# Patient Record
Sex: Male | Born: 1965 | Race: White | Hispanic: No | Marital: Single | Smoking: Former smoker
Health system: Southern US, Community
[De-identification: ages and names within clinical notes are randomized; demographics above are authoritative.]

## PROBLEM LIST (undated history)

## (undated) DIAGNOSIS — S069XAA Unspecified intracranial injury with loss of consciousness status unknown, initial encounter: Secondary | ICD-10-CM

## (undated) DIAGNOSIS — S060X9A Concussion with loss of consciousness of unspecified duration, initial encounter: Secondary | ICD-10-CM

## (undated) DIAGNOSIS — R519 Headache, unspecified: Secondary | ICD-10-CM

## (undated) DIAGNOSIS — K219 Gastro-esophageal reflux disease without esophagitis: Secondary | ICD-10-CM

## (undated) DIAGNOSIS — S2691XA Contusion of heart, unspecified with or without hemopericardium, initial encounter: Secondary | ICD-10-CM

## (undated) DIAGNOSIS — M1990 Unspecified osteoarthritis, unspecified site: Secondary | ICD-10-CM

## (undated) DIAGNOSIS — S060XAA Concussion with loss of consciousness status unknown, initial encounter: Secondary | ICD-10-CM

## (undated) DIAGNOSIS — Z8719 Personal history of other diseases of the digestive system: Secondary | ICD-10-CM

## (undated) DIAGNOSIS — R112 Nausea with vomiting, unspecified: Secondary | ICD-10-CM

## (undated) DIAGNOSIS — F419 Anxiety disorder, unspecified: Secondary | ICD-10-CM

## (undated) DIAGNOSIS — S069X9A Unspecified intracranial injury with loss of consciousness of unspecified duration, initial encounter: Secondary | ICD-10-CM

## (undated) DIAGNOSIS — S199XXA Unspecified injury of neck, initial encounter: Secondary | ICD-10-CM

## (undated) DIAGNOSIS — R51 Headache: Secondary | ICD-10-CM

## (undated) DIAGNOSIS — G8929 Other chronic pain: Secondary | ICD-10-CM

## (undated) DIAGNOSIS — F449 Dissociative and conversion disorder, unspecified: Secondary | ICD-10-CM

## (undated) DIAGNOSIS — S2220XA Unspecified fracture of sternum, initial encounter for closed fracture: Secondary | ICD-10-CM

## (undated) DIAGNOSIS — F32A Depression, unspecified: Secondary | ICD-10-CM

## (undated) DIAGNOSIS — F4310 Post-traumatic stress disorder, unspecified: Secondary | ICD-10-CM

## (undated) DIAGNOSIS — M546 Pain in thoracic spine: Secondary | ICD-10-CM

## (undated) DIAGNOSIS — M549 Dorsalgia, unspecified: Secondary | ICD-10-CM

## (undated) DIAGNOSIS — T7840XA Allergy, unspecified, initial encounter: Secondary | ICD-10-CM

## (undated) DIAGNOSIS — Z9889 Other specified postprocedural states: Secondary | ICD-10-CM

## (undated) DIAGNOSIS — G43909 Migraine, unspecified, not intractable, without status migrainosus: Secondary | ICD-10-CM

## (undated) DIAGNOSIS — Z8489 Family history of other specified conditions: Secondary | ICD-10-CM

## (undated) DIAGNOSIS — I499 Cardiac arrhythmia, unspecified: Secondary | ICD-10-CM

## (undated) HISTORY — DX: Allergy, unspecified, initial encounter: T78.40XA

## (undated) HISTORY — PX: SHOULDER ARTHROSCOPY W/ ROTATOR CUFF REPAIR: SHX2400

## (undated) HISTORY — PX: TONSILLECTOMY: SUR1361

## (undated) HISTORY — DX: Anxiety disorder, unspecified: F41.9

## (undated) HISTORY — DX: Unspecified osteoarthritis, unspecified site: M19.90

## (undated) HISTORY — PX: CYST EXCISION: SHX5701

## (undated) HISTORY — PX: KNEE ARTHROSCOPY: SHX127

---

## 1999-11-18 ENCOUNTER — Ambulatory Visit (HOSPITAL_COMMUNITY): Admission: RE | Admit: 1999-11-18 | Discharge: 1999-11-18 | Payer: Self-pay | Admitting: Specialist

## 1999-11-18 ENCOUNTER — Encounter: Payer: Self-pay | Admitting: Specialist

## 1999-12-09 ENCOUNTER — Encounter: Payer: Self-pay | Admitting: Specialist

## 1999-12-09 ENCOUNTER — Ambulatory Visit (HOSPITAL_COMMUNITY): Admission: RE | Admit: 1999-12-09 | Discharge: 1999-12-09 | Payer: Self-pay | Admitting: Specialist

## 2002-12-01 ENCOUNTER — Emergency Department (HOSPITAL_COMMUNITY): Admission: EM | Admit: 2002-12-01 | Discharge: 2002-12-01 | Payer: Self-pay | Admitting: Emergency Medicine

## 2002-12-01 ENCOUNTER — Encounter: Payer: Self-pay | Admitting: Emergency Medicine

## 2003-01-07 ENCOUNTER — Encounter: Payer: Self-pay | Admitting: Family Medicine

## 2003-01-07 ENCOUNTER — Encounter: Admission: RE | Admit: 2003-01-07 | Discharge: 2003-01-07 | Payer: Self-pay | Admitting: Family Medicine

## 2003-05-07 ENCOUNTER — Encounter: Payer: Self-pay | Admitting: Emergency Medicine

## 2003-05-07 ENCOUNTER — Emergency Department (HOSPITAL_COMMUNITY): Admission: EM | Admit: 2003-05-07 | Discharge: 2003-05-07 | Payer: Self-pay | Admitting: Emergency Medicine

## 2003-06-11 ENCOUNTER — Encounter: Admission: RE | Admit: 2003-06-11 | Discharge: 2003-06-11 | Payer: Self-pay | Admitting: Family Medicine

## 2003-06-11 ENCOUNTER — Encounter: Payer: Self-pay | Admitting: Family Medicine

## 2003-11-30 ENCOUNTER — Encounter: Admission: RE | Admit: 2003-11-30 | Discharge: 2003-11-30 | Payer: Self-pay | Admitting: Family Medicine

## 2004-12-26 ENCOUNTER — Encounter: Admission: RE | Admit: 2004-12-26 | Discharge: 2004-12-26 | Payer: Self-pay | Admitting: Family Medicine

## 2005-09-14 ENCOUNTER — Encounter: Admission: RE | Admit: 2005-09-14 | Discharge: 2005-09-14 | Payer: Self-pay | Admitting: Family Medicine

## 2006-05-21 ENCOUNTER — Other Ambulatory Visit: Payer: Self-pay

## 2006-05-21 ENCOUNTER — Emergency Department: Payer: Self-pay | Admitting: Unknown Physician Specialty

## 2007-03-11 ENCOUNTER — Other Ambulatory Visit: Payer: Self-pay

## 2007-03-11 ENCOUNTER — Emergency Department: Payer: Self-pay | Admitting: Emergency Medicine

## 2007-07-26 ENCOUNTER — Ambulatory Visit: Payer: Self-pay | Admitting: Orthopedic Surgery

## 2007-08-14 ENCOUNTER — Ambulatory Visit: Payer: Self-pay | Admitting: Orthopedic Surgery

## 2008-07-18 ENCOUNTER — Emergency Department: Payer: Self-pay | Admitting: Emergency Medicine

## 2008-12-18 HISTORY — PX: LIPOMA EXCISION: SHX5283

## 2010-07-18 ENCOUNTER — Emergency Department: Payer: Self-pay | Admitting: Emergency Medicine

## 2010-07-25 ENCOUNTER — Ambulatory Visit: Payer: Self-pay | Admitting: Surgery

## 2010-07-27 ENCOUNTER — Ambulatory Visit: Payer: Self-pay | Admitting: Surgery

## 2010-07-28 LAB — PATHOLOGY REPORT

## 2011-03-29 ENCOUNTER — Emergency Department: Payer: Self-pay | Admitting: Unknown Physician Specialty

## 2012-10-10 ENCOUNTER — Emergency Department: Payer: Self-pay | Admitting: Internal Medicine

## 2012-10-10 LAB — COMPREHENSIVE METABOLIC PANEL
Albumin: 4.2 g/dL (ref 3.4–5.0)
Anion Gap: 7 (ref 7–16)
BUN: 8 mg/dL (ref 7–18)
Bilirubin,Total: 0.9 mg/dL (ref 0.2–1.0)
Chloride: 102 mmol/L (ref 98–107)
Creatinine: 1.07 mg/dL (ref 0.60–1.30)
EGFR (Non-African Amer.): >60
Glucose: 88 mg/dL (ref 65–99)
Potassium: 4.2 mmol/L (ref 3.5–5.1)
SGOT(AST): 31 U/L (ref 15–37)
SGPT (ALT): 20 U/L (ref 12–78)
Sodium: 139 mmol/L (ref 136–145)
Total Protein: 7.6 g/dL (ref 6.4–8.2)

## 2012-10-10 LAB — CBC
HCT: 50.5 % (ref 40.0–52.0)
MCH: 31.2 pg (ref 26.0–34.0)
MCHC: 34.4 g/dL (ref 32.0–36.0)
MCV: 91 fL (ref 80–100)
Platelet: 256 10*3/uL (ref 150–440)
RBC: 5.57 10*6/uL (ref 4.40–5.90)

## 2012-10-10 LAB — PROTIME-INR: INR: 1

## 2012-10-23 ENCOUNTER — Other Ambulatory Visit: Payer: Self-pay | Admitting: Internal Medicine

## 2012-10-23 NOTE — Telephone Encounter (Signed)
Chart pulled to PA pool at nurses station DOS 02/25/09

## 2012-10-24 ENCOUNTER — Other Ambulatory Visit: Payer: Self-pay | Admitting: Internal Medicine

## 2012-10-24 NOTE — Telephone Encounter (Signed)
Chart pulled to PA pool at nurses station DOS 02/25/09 °

## 2013-05-22 ENCOUNTER — Ambulatory Visit: Payer: BC Managed Care – PPO

## 2013-05-22 ENCOUNTER — Ambulatory Visit (INDEPENDENT_AMBULATORY_CARE_PROVIDER_SITE_OTHER): Payer: BC Managed Care – PPO | Admitting: Family Medicine

## 2013-05-22 VITALS — BP 142/82 | HR 75 | Temp 97.7°F | Resp 18 | Ht 68.0 in | Wt 152.6 lb

## 2013-05-22 DIAGNOSIS — M25511 Pain in right shoulder: Secondary | ICD-10-CM

## 2013-05-22 DIAGNOSIS — M25519 Pain in unspecified shoulder: Secondary | ICD-10-CM

## 2013-05-22 MED ORDER — HYDROCODONE-ACETAMINOPHEN 5-325 MG PO TABS: 1.0000 | ORAL_TABLET | Freq: Four times a day (QID) | ORAL | Status: DC | PRN
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
## 2013-05-22 NOTE — Patient Instructions (Signed)
We will refer you to orthopaedic doctor.  Use the sling for comfort, hydrocodone if needed for pain. Sling as needed - avoid use at work until seen by ortho.  Return to the clinic or go to the nearest emergency room if any of your symptoms worsen or new symptoms occur.

## 2013-05-23 ENCOUNTER — Telehealth: Payer: Self-pay

## 2013-05-23 DIAGNOSIS — M25511 Pain in right shoulder: Secondary | ICD-10-CM

## 2013-05-23 MED ORDER — OXYCODONE-ACETAMINOPHEN 5-325 MG PO TABS: 1.0000 | ORAL_TABLET | Freq: Three times a day (TID) | ORAL | Status: DC | PRN

## 2013-05-23 NOTE — Telephone Encounter (Signed)
xrays are at front desk for pt to pick up.

## 2013-05-23 NOTE — Telephone Encounter (Signed)
Pt is calling again regarding a different pain medication being called into the pharmacy, pt states that he uses [facility] in [place]. Best# [phone]

## 2013-05-23 NOTE — Telephone Encounter (Signed)
Patient says that the pain medication we prescribed was not working would like to know if we can prescribe something else

## 2013-05-23 NOTE — Telephone Encounter (Signed)
Patient notified and will discontinue the use of Vicodin. He also needs a copy of his x-ray to take to [facility], he has an appt scheduled on 06/02/2013. He was informed of the possible dizziness/sedation while taking Percocet.

## 2013-05-23 NOTE — Telephone Encounter (Signed)
I will prescribe percocet in place of the vicodin, but if this is not helping  - needs to go to the emergency room or return to clinic. Advise that percocet may make more sedated/dizzy - do not drive or operate heavy machinery while taking this medicine.

## 2013-05-29 ENCOUNTER — Telehealth: Payer: Self-pay

## 2013-05-29 DIAGNOSIS — M25511 Pain in right shoulder: Secondary | ICD-10-CM

## 2013-05-29 MED ORDER — OXYCODONE-ACETAMINOPHEN 5-325 MG PO TABS: 1.0000 | ORAL_TABLET | Freq: Three times a day (TID) | ORAL | Status: DC | PRN

## 2013-05-29 NOTE — Telephone Encounter (Signed)
Pt would like to have a refill on his pain medication, pt states that he is will be out after today. Best# [phone]

## 2013-05-29 NOTE — Telephone Encounter (Signed)
Noted - will prescribe enough until he can be seen by ortho - should have appt scheduled in 4 days.

## 2013-05-30 NOTE — Telephone Encounter (Signed)
Notified pt Rx ready for p/up. Pt stated he is in a lot of pain, but we were able to get an appt for Monday w/ortho.

## 2015-11-18 DIAGNOSIS — S2691XA Contusion of heart, unspecified with or without hemopericardium, initial encounter: Secondary | ICD-10-CM

## 2015-11-18 HISTORY — DX: Contusion of heart, unspecified with or without hemopericardium, initial encounter: S26.91XA

## 2015-12-15 ENCOUNTER — Emergency Department (HOSPITAL_COMMUNITY): Payer: 59

## 2015-12-15 ENCOUNTER — Encounter (HOSPITAL_COMMUNITY): Payer: Self-pay | Admitting: *Deleted

## 2015-12-15 ENCOUNTER — Observation Stay (HOSPITAL_COMMUNITY)
Admission: EM | Admit: 2015-12-15 | Discharge: 2015-12-18 | Disposition: A | Discharge status: Home / Self Care | Payer: 59 | Attending: General Surgery | Admitting: General Surgery

## 2015-12-15 DIAGNOSIS — S060X0A Concussion without loss of consciousness, initial encounter: Secondary | ICD-10-CM | POA: Insufficient documentation

## 2015-12-15 DIAGNOSIS — G8929 Other chronic pain: Secondary | ICD-10-CM | POA: Diagnosis present

## 2015-12-15 DIAGNOSIS — S060X9A Concussion with loss of consciousness of unspecified duration, initial encounter: Secondary | ICD-10-CM | POA: Diagnosis present

## 2015-12-15 DIAGNOSIS — S060XAA Concussion with loss of consciousness status unknown, initial encounter: Secondary | ICD-10-CM | POA: Diagnosis present

## 2015-12-15 DIAGNOSIS — M25561 Pain in right knee: Secondary | ICD-10-CM | POA: Insufficient documentation

## 2015-12-15 DIAGNOSIS — S2220XA Unspecified fracture of sternum, initial encounter for closed fracture: Secondary | ICD-10-CM

## 2015-12-15 DIAGNOSIS — Y92410 Unspecified street and highway as the place of occurrence of the external cause: Secondary | ICD-10-CM | POA: Insufficient documentation

## 2015-12-15 DIAGNOSIS — S2222XA Fracture of body of sternum, initial encounter for closed fracture: Principal | ICD-10-CM | POA: Insufficient documentation

## 2015-12-15 DIAGNOSIS — S2691XA Contusion of heart, unspecified with or without hemopericardium, initial encounter: Secondary | ICD-10-CM | POA: Diagnosis present

## 2015-12-15 DIAGNOSIS — M25562 Pain in left knee: Secondary | ICD-10-CM | POA: Insufficient documentation

## 2015-12-15 HISTORY — DX: Gastro-esophageal reflux disease without esophagitis: K21.9

## 2015-12-15 HISTORY — DX: Migraine, unspecified, not intractable, without status migrainosus: G43.909

## 2015-12-15 HISTORY — DX: Concussion with loss of consciousness status unknown, initial encounter: S06.0XAA

## 2015-12-15 HISTORY — DX: Concussion with loss of consciousness of unspecified duration, initial encounter: S06.0X9A

## 2015-12-15 HISTORY — DX: Headache: R51

## 2015-12-15 HISTORY — DX: Unspecified fracture of sternum, initial encounter for closed fracture: S22.20XA

## 2015-12-15 HISTORY — DX: Unspecified injury of neck, initial encounter: S19.9XXA

## 2015-12-15 HISTORY — DX: Cardiac arrhythmia, unspecified: I49.9

## 2015-12-15 HISTORY — DX: Pain in thoracic spine: M54.6

## 2015-12-15 HISTORY — DX: Other specified postprocedural states: Z98.890

## 2015-12-15 HISTORY — DX: Other chronic pain: G89.29

## 2015-12-15 HISTORY — DX: Other specified postprocedural states: R11.2

## 2015-12-15 HISTORY — DX: Dorsalgia, unspecified: M54.9

## 2015-12-15 HISTORY — DX: Family history of other specified conditions: Z84.89

## 2015-12-15 HISTORY — DX: Personal history of other diseases of the digestive system: Z87.19

## 2015-12-15 HISTORY — DX: Headache, unspecified: R51.9

## 2015-12-15 LAB — CBC WITH DIFFERENTIAL/PLATELET
BASOS ABS: 0 10*3/uL (ref 0.0–0.1)
Basophils Relative: 0 %
Eosinophils Absolute: 0.3 10*3/uL (ref 0.0–0.7)
Eosinophils Relative: 2 %
HEMATOCRIT: 42 % (ref 39.0–52.0)
Hemoglobin: 13.6 g/dL (ref 13.0–17.0)
LYMPHS ABS: 3.4 10*3/uL (ref 0.7–4.0)
LYMPHS PCT: 19 %
MCH: 28.8 pg (ref 26.0–34.0)
MCHC: 32.4 g/dL (ref 30.0–36.0)
MCV: 89 fL (ref 78.0–100.0)
MONO ABS: 1.5 10*3/uL — AB (ref 0.1–1.0)
MONOS PCT: 9 %
NEUTROS ABS: 12.1 10*3/uL — AB (ref 1.7–7.7)
Neutrophils Relative %: 70 %
Platelets: 330 10*3/uL (ref 150–400)
RBC: 4.72 MIL/uL (ref 4.22–5.81)
RDW: 12.7 % (ref 11.5–15.5)
WBC: 17.2 10*3/uL — ABNORMAL HIGH (ref 4.0–10.5)

## 2015-12-15 LAB — BASIC METABOLIC PANEL
ANION GAP: 12 (ref 5–15)
BUN: <5 mg/dL — ABNORMAL LOW (ref 6–20)
CALCIUM: 9.3 mg/dL (ref 8.9–10.3)
CO2: 25 mmol/L (ref 22–32)
Chloride: 102 mmol/L (ref 101–111)
Creatinine, Ser: 1.1 mg/dL (ref 0.61–1.24)
GLUCOSE: 125 mg/dL — AB (ref 65–99)
POTASSIUM: 3.2 mmol/L — AB (ref 3.5–5.1)
Sodium: 139 mmol/L (ref 135–145)

## 2015-12-15 MED ORDER — ONDANSETRON HCL 4 MG/2ML IJ SOLN
4.0000 mg | Freq: Once | INTRAMUSCULAR | Status: DC
  Filled 2015-12-15: qty 2

## 2015-12-15 MED ORDER — SODIUM CHLORIDE 0.9 % IV BOLUS (SEPSIS)
1000.0000 mL | Freq: Once | INTRAVENOUS | Status: AC
  Administered 2015-12-15: 1000 mL via INTRAVENOUS

## 2015-12-15 MED ORDER — IOHEXOL 350 MG/ML SOLN
100.0000 mL | Freq: Once | INTRAVENOUS | Status: AC | PRN
  Administered 2015-12-15: 100 mL via INTRAVENOUS

## 2015-12-15 MED ORDER — DOCUSATE SODIUM 100 MG PO CAPS
100.0000 mg | ORAL_CAPSULE | Freq: Two times a day (BID) | ORAL | Status: DC
  Administered 2015-12-15 – 2015-12-18 (×6): 100 mg via ORAL
  Filled 2015-12-15 (×6): qty 1

## 2015-12-15 MED ORDER — GABAPENTIN 300 MG PO CAPS
600.0000 mg | ORAL_CAPSULE | Freq: Every evening | ORAL | Status: DC | PRN
  Administered 2015-12-17: 600 mg via ORAL
  Filled 2015-12-15: qty 2

## 2015-12-15 MED ORDER — MORPHINE SULFATE (PF) 4 MG/ML IV SOLN
4.0000 mg | INTRAVENOUS | Status: DC | PRN
  Administered 2015-12-15: 2 mg via INTRAVENOUS
  Administered 2015-12-15: 4 mg via INTRAVENOUS
  Filled 2015-12-15 (×3): qty 1

## 2015-12-15 MED ORDER — ENOXAPARIN SODIUM 40 MG/0.4ML ~~LOC~~ SOLN
40.0000 mg | SUBCUTANEOUS | Status: DC
  Administered 2015-12-15 – 2015-12-17 (×3): 40 mg via SUBCUTANEOUS
  Filled 2015-12-15 (×3): qty 0.4

## 2015-12-15 MED ORDER — HYDROMORPHONE HCL 1 MG/ML IJ SOLN
1.0000 mg | Freq: Once | INTRAMUSCULAR | Status: AC
  Administered 2015-12-15: 1 mg via INTRAVENOUS
  Filled 2015-12-15: qty 1

## 2015-12-15 MED ORDER — KCL IN DEXTROSE-NACL 40-5-0.45 MEQ/L-%-% IV SOLN
INTRAVENOUS | Status: DC
  Administered 2015-12-15: 18:00:00 via INTRAVENOUS
  Filled 2015-12-15 (×4): qty 1000

## 2015-12-15 MED ORDER — ALPRAZOLAM 0.5 MG PO TABS
0.5000 mg | ORAL_TABLET | Freq: Every day | ORAL | Status: DC
  Administered 2015-12-15 – 2015-12-17 (×3): 0.5 mg via ORAL
  Filled 2015-12-15 (×3): qty 1

## 2015-12-15 MED ORDER — ONDANSETRON HCL 4 MG/2ML IJ SOLN: 4.0000 mg | Freq: Four times a day (QID) | INTRAMUSCULAR | Status: DC | PRN

## 2015-12-15 MED ORDER — CYCLOBENZAPRINE HCL 10 MG PO TABS
10.0000 mg | ORAL_TABLET | Freq: Three times a day (TID) | ORAL | Status: DC | PRN
  Administered 2015-12-16 – 2015-12-17 (×2): 10 mg via ORAL
  Filled 2015-12-15 (×2): qty 1

## 2015-12-15 MED ORDER — KETAMINE HCL 10 MG/ML IJ SOLN
1.0000 mg/kg | Freq: Once | INTRAMUSCULAR | Status: DC
  Filled 2015-12-15: qty 7.4

## 2015-12-15 MED ORDER — OXYCODONE HCL 5 MG PO TABS
10.0000 mg | ORAL_TABLET | ORAL | Status: DC | PRN
  Administered 2015-12-15 – 2015-12-18 (×13): 20 mg via ORAL
  Filled 2015-12-15 (×13): qty 4

## 2015-12-15 MED ORDER — ONDANSETRON HCL 4 MG PO TABS: 4.0000 mg | ORAL_TABLET | Freq: Four times a day (QID) | ORAL | Status: DC | PRN

## 2015-12-15 MED ORDER — MIDAZOLAM HCL 2 MG/2ML IJ SOLN
2.0000 mg | Freq: Once | INTRAMUSCULAR | Status: DC | PRN
Start: 2015-12-15 — End: 2015-12-15
  Filled 2015-12-15: qty 2

## 2015-12-15 MED ORDER — POLYETHYLENE GLYCOL 3350 17 G PO PACK
17.0000 g | PACK | Freq: Every day | ORAL | Status: DC
  Filled 2015-12-15 (×3): qty 1

## 2015-12-15 MED ORDER — DICLOFENAC SODIUM 1 % TD GEL
2.0000 g | Freq: Four times a day (QID) | TRANSDERMAL | Status: DC | PRN
  Filled 2015-12-15: qty 100

## 2015-12-15 MED ORDER — MORPHINE SULFATE ER 30 MG PO TBCR
30.0000 mg | EXTENDED_RELEASE_TABLET | Freq: Two times a day (BID) | ORAL | Status: DC
  Filled 2015-12-15: qty 1

## 2015-12-15 NOTE — ED Notes (Signed)
Radiology advised patient unable to lie back for pelvic xray, Dr. Clydene PughKnott made aware, advised pelvic xray can be canceled.
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
## 2015-12-15 NOTE — ED Notes (Signed)
Dr. Clydene PughKnott aware that patient is still in a lot of pain, advised patient can get 1mg  Dilaudid IV

## 2015-12-15 NOTE — ED Notes (Signed)
MD at bedside. 

## 2015-12-15 NOTE — ED Notes (Signed)
Trauma PA at bedside. 

## 2015-12-15 NOTE — ED Notes (Signed)
Patient provided sandwich per request 
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
## 2015-12-15 NOTE — ED Notes (Signed)
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

## 2015-12-15 NOTE — ED Notes (Signed)
Pt returned from CT °

## 2015-12-15 NOTE — ED Notes (Signed)
Pt stated he would like to try CT scan without being medicated. Pt able to lie flat on stretcher and this time and tolerate. Pt transported to CT by CT transporter.
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
## 2015-12-15 NOTE — ED Notes (Signed)
Radiology brought patient back to the room stating they could not get all of his pictures because he was reporting his pain was too bad. DR. Clydene PughKnott made aware of situation.

## 2015-12-16 ENCOUNTER — Observation Stay (HOSPITAL_COMMUNITY): Payer: 59

## 2015-12-16 DIAGNOSIS — S2691XA Contusion of heart, unspecified with or without hemopericardium, initial encounter: Secondary | ICD-10-CM | POA: Diagnosis present

## 2015-12-16 DIAGNOSIS — S060X9A Concussion with loss of consciousness of unspecified duration, initial encounter: Secondary | ICD-10-CM | POA: Diagnosis present

## 2015-12-16 DIAGNOSIS — S060XAA Concussion with loss of consciousness status unknown, initial encounter: Secondary | ICD-10-CM | POA: Diagnosis present

## 2015-12-16 DIAGNOSIS — G8929 Other chronic pain: Secondary | ICD-10-CM | POA: Diagnosis present

## 2015-12-16 LAB — CBC
HCT: 36.7 % — ABNORMAL LOW (ref 39.0–52.0)
HEMOGLOBIN: 12.3 g/dL — AB (ref 13.0–17.0)
MCH: 29.9 pg (ref 26.0–34.0)
MCHC: 33.5 g/dL (ref 30.0–36.0)
MCV: 89.1 fL (ref 78.0–100.0)
Platelets: 264 10*3/uL (ref 150–400)
RBC: 4.12 MIL/uL — ABNORMAL LOW (ref 4.22–5.81)
RDW: 12.9 % (ref 11.5–15.5)
WBC: 12.9 10*3/uL — ABNORMAL HIGH (ref 4.0–10.5)

## 2015-12-16 LAB — BASIC METABOLIC PANEL
Anion gap: 6 (ref 5–15)
CALCIUM: 8.4 mg/dL — AB (ref 8.9–10.3)
CHLORIDE: 103 mmol/L (ref 101–111)
CO2: 27 mmol/L (ref 22–32)
CREATININE: 0.99 mg/dL (ref 0.61–1.24)
GFR calc Af Amer: >60 mL/min (ref >60)
Glucose, Bld: 120 mg/dL — ABNORMAL HIGH (ref 65–99)
Potassium: 3.4 mmol/L — ABNORMAL LOW (ref 3.5–5.1)
SODIUM: 136 mmol/L (ref 135–145)

## 2015-12-16 MED ORDER — NAPROXEN 250 MG PO TABS
500.0000 mg | ORAL_TABLET | Freq: Two times a day (BID) | ORAL | Status: DC
  Filled 2015-12-16: qty 2

## 2015-12-16 MED ORDER — WHITE PETROLATUM GEL
Status: AC
  Administered 2015-12-16: 16:00:00
  Filled 2015-12-16: qty 1

## 2015-12-16 MED ORDER — MORPHINE SULFATE ER 30 MG PO TBCR
45.0000 mg | EXTENDED_RELEASE_TABLET | Freq: Two times a day (BID) | ORAL | Status: DC
  Administered 2015-12-16: 45 mg via ORAL
  Filled 2015-12-16: qty 1

## 2015-12-16 MED ORDER — TRAMADOL HCL 50 MG PO TABS
100.0000 mg | ORAL_TABLET | Freq: Four times a day (QID) | ORAL | Status: DC
  Administered 2015-12-16 – 2015-12-18 (×10): 100 mg via ORAL
  Filled 2015-12-16 (×10): qty 2

## 2015-12-16 MED ORDER — POTASSIUM CHLORIDE CRYS ER 20 MEQ PO TBCR
20.0000 meq | EXTENDED_RELEASE_TABLET | Freq: Three times a day (TID) | ORAL | Status: DC
  Administered 2015-12-16 – 2015-12-18 (×7): 20 meq via ORAL
  Filled 2015-12-16 (×7): qty 1

## 2015-12-16 MED ORDER — KETOROLAC TROMETHAMINE 15 MG/ML IJ SOLN
15.0000 mg | Freq: Four times a day (QID) | INTRAMUSCULAR | Status: AC
  Administered 2015-12-16 – 2015-12-18 (×8): 15 mg via INTRAVENOUS
  Filled 2015-12-16 (×8): qty 1

## 2015-12-16 MED ORDER — KETOROLAC TROMETHAMINE 30 MG/ML IJ SOLN
30.0000 mg | Freq: Once | INTRAMUSCULAR | Status: AC
  Administered 2015-12-16: 30 mg via INTRAVENOUS
  Filled 2015-12-16: qty 1
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
## 2015-12-16 NOTE — Progress Notes (Addendum)
OT Cancellation Note  Patient Details Name: [name] MRN: [id number] DOB: 1966-06-30   Cancelled Treatment:    Reason Eval/Treat Not Completed: Pain limiting ability to participate. Pt reports 10/10 pain. After explaining OT evaluation pt stating, "Well it's not going to happen now." Nursing notified of reported pain level. OT to reattempt as schedule permits.  Pilar GrammesMathews, Mack Thurmon H 12/16/2015, 11:56 AM

## 2015-12-16 NOTE — Progress Notes (Signed)
Echocardiogram 2D Echocardiogram has been performed.  Dorothey BasemanReel, Laneka Mcgrory M 12/16/2015, 11:06 AM

## 2015-12-16 NOTE — Progress Notes (Signed)
OT Cancellation Note  Patient Details Name: [name] MRN: [id number] DOB: February 18, 1966   Cancelled Treatment:    Reason Eval/Treat Not Completed: Other (comment) (Pt asleep and has verbalized desire for uninterupted sleep.) Second attempt, tried to time with pain meds this time. Pt's daughter informed therapist that pt has been upset about getting interrupted "everytime he starts to fall asleep." Lunch tray just delivered to room. Pt declined therapist offer to help pt to recliner to eat lunch. OT to reattempt, likely tomorrow; informed pt of timeline for return.  Pilar GrammesMathews, Freyja Govea H 12/16/2015, 1:39 PM
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
## 2015-12-17 MED ORDER — MORPHINE SULFATE (PF) 2 MG/ML IV SOLN
2.0000 mg | INTRAVENOUS | Status: DC | PRN
  Administered 2015-12-17 (×2): 2 mg via INTRAVENOUS
  Filled 2015-12-17: qty 1

## 2015-12-17 MED ORDER — MORPHINE SULFATE ER 30 MG PO TBCR
60.0000 mg | EXTENDED_RELEASE_TABLET | Freq: Two times a day (BID) | ORAL | Status: DC
  Administered 2015-12-17 – 2015-12-18 (×3): 60 mg via ORAL
  Filled 2015-12-17 (×3): qty 2
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
## 2015-12-17 NOTE — Progress Notes (Signed)
Pt. Encouraged and taught to deep breathe and use incentive spirometer because his O2 sats were low. I explained this to him. His response was "I can't". He states he can't deep breathe because it hurts too much. O2 sats remained low and I had to put O2 on him. Nasal Cannula at 2L/min. O2 sats came up to 94%.
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
## 2015-12-17 NOTE — Progress Notes (Addendum)
PT Cancellation Note  Patient Details Name: [name] MRN: [id number] DOB: 1966-10-04   Cancelled Treatment:    Reason Eval/Treat Not Completed: Patient declined, no reason specified (pt stating he got pain medicine and needs to sleep it off. Pt educated for benefit of OOB and mobility but declined. He is aware of need to be mobile to D/C. Will attempt later as time allows)   Attempted again at 1259 and pt again refused to do anything stating he wants to lie in bed. Pt states he hurts too bad to do anything and will refuse to leave today due to pain. RN present and aware.    Toney Sangabor, Khylon Davies Beth 12/17/2015, 11:22 AM Delaney MeigsMaija Tabor Anamari Galeas, PT [phone]

## 2015-12-18 ENCOUNTER — Telehealth: Payer: Self-pay | Admitting: General Surgery

## 2015-12-18 MED ORDER — MORPHINE SULFATE ER 15 MG PO TBCR: EXTENDED_RELEASE_TABLET | ORAL | Status: DC

## 2015-12-18 MED ORDER — TRAMADOL HCL 50 MG PO TABS: 100.0000 mg | ORAL_TABLET | Freq: Four times a day (QID) | ORAL | Status: DC

## 2015-12-18 MED ORDER — OXYCODONE-ACETAMINOPHEN 10-325 MG PO TABS: 1.0000 | ORAL_TABLET | ORAL | Status: DC | PRN
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
## 2015-12-18 NOTE — Telephone Encounter (Signed)
Pt called unable to fill pain Rx due to needing prior authorization by his insurance company.
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
## 2015-12-19 NOTE — Telephone Encounter (Signed)
Tried to call for preauth but office is closed today. Will try again in am. Spoke with pt/gf and apprised them of situation.

## 2015-12-20 ENCOUNTER — Encounter: Payer: Self-pay | Admitting: Orthopedic Surgery

## 2015-12-20 NOTE — Telephone Encounter (Signed)
Faxed letter and d/c summary to prior [employer] company.

## 2015-12-21 NOTE — Telephone Encounter (Signed)
Got approval from insurance via fax. Communicated that to pt. Also gave him office fax # and address to send his STD paperwork and told him to call on Thursday for refill of Percocet if needed.

## 2015-12-22 ENCOUNTER — Telehealth (HOSPITAL_COMMUNITY): Payer: Self-pay | Admitting: Orthopedic Surgery

## 2015-12-23 NOTE — Telephone Encounter (Signed)
Discussed case with Dr. Janee Mornhompson who agreed to manage prn medications for the month while his MS Contin tapers down. Gave rx for Perc 10/325, #60.

## 2015-12-24 ENCOUNTER — Telehealth (HOSPITAL_COMMUNITY): Payer: Self-pay | Admitting: Orthopedic Surgery

## 2015-12-24 NOTE — Telephone Encounter (Signed)
Called back the pharmacy, but they said they already talked to someone regarding the prescription and no longer needed my assistance.

## 2015-12-30 ENCOUNTER — Telehealth: Payer: Self-pay | Admitting: Orthopedic Surgery

## 2015-12-30 NOTE — Telephone Encounter (Signed)
Refilled Percocet 10/325, #60

## 2016-01-04 NOTE — Telephone Encounter (Signed)
Pt couldn't remember why he called but was having some knee problems. I referred him to [facility] or [facility].

## 2016-01-10 ENCOUNTER — Telehealth: Payer: Self-pay | Admitting: Orthopedic Surgery

## 2016-01-10 ENCOUNTER — Telehealth (HOSPITAL_COMMUNITY): Payer: Self-pay | Admitting: Orthopedic Surgery

## 2016-01-10 NOTE — Telephone Encounter (Signed)
Refilled Percocet 10/325, #60 and told him this would be the last rx we would give him. He also c/o chest pain ~3x/week and wondered if he should get that checked out. I suggested PCP, cardiology, or UC visit if it continued to bother him.

## 2016-01-12 NOTE — Telephone Encounter (Signed)
Pt's FMLA had been faxed already.

## 2016-01-17 ENCOUNTER — Telehealth (HOSPITAL_COMMUNITY): Payer: Self-pay | Admitting: Orthopedic Surgery

## 2016-01-17 NOTE — Telephone Encounter (Signed)
Directed pt to call [employer] for questions about FMLA paperwork.

## 2016-02-02 ENCOUNTER — Telehealth (HOSPITAL_COMMUNITY): Payer: Self-pay | Admitting: Orthopedic Surgery

## 2016-02-03 ENCOUNTER — Telehealth (HOSPITAL_COMMUNITY): Payer: Self-pay

## 2016-02-03 NOTE — Telephone Encounter (Signed)
LM informing pt that referral had been made.

## 2016-02-03 NOTE — Telephone Encounter (Signed)
Authorized referral for pt.

## 2016-02-10 DIAGNOSIS — G44229 Chronic tension-type headache, not intractable: Secondary | ICD-10-CM | POA: Insufficient documentation

## 2016-03-02 ENCOUNTER — Other Ambulatory Visit: Payer: Self-pay | Admitting: Neurology

## 2016-03-02 DIAGNOSIS — G44319 Acute post-traumatic headache, not intractable: Secondary | ICD-10-CM

## 2016-03-02 DIAGNOSIS — R410 Disorientation, unspecified: Secondary | ICD-10-CM

## 2016-03-08 ENCOUNTER — Ambulatory Visit
Admission: RE | Admit: 2016-03-08 | Discharge: 2016-03-08 | Disposition: A | Discharge status: Home / Self Care | Payer: No Typology Code available for payment source | Source: Ambulatory Visit | Attending: Neurology | Admitting: Neurology

## 2016-03-08 DIAGNOSIS — R410 Disorientation, unspecified: Secondary | ICD-10-CM | POA: Diagnosis not present

## 2016-03-08 DIAGNOSIS — R42 Dizziness and giddiness: Secondary | ICD-10-CM | POA: Diagnosis present

## 2016-03-08 DIAGNOSIS — R2681 Unsteadiness on feet: Secondary | ICD-10-CM | POA: Insufficient documentation

## 2016-03-08 DIAGNOSIS — G44319 Acute post-traumatic headache, not intractable: Secondary | ICD-10-CM | POA: Insufficient documentation

## 2016-03-14 DIAGNOSIS — H9312 Tinnitus, left ear: Secondary | ICD-10-CM | POA: Insufficient documentation

## 2016-03-14 DIAGNOSIS — R413 Other amnesia: Secondary | ICD-10-CM | POA: Insufficient documentation

## 2016-03-14 DIAGNOSIS — H538 Other visual disturbances: Secondary | ICD-10-CM | POA: Insufficient documentation

## 2016-03-16 ENCOUNTER — Other Ambulatory Visit: Payer: Self-pay | Admitting: Orthopedic Surgery

## 2016-03-16 DIAGNOSIS — S4392XA Sprain of unspecified parts of left shoulder girdle, initial encounter: Secondary | ICD-10-CM

## 2016-04-06 ENCOUNTER — Ambulatory Visit
Admission: RE | Admit: 2016-04-06 | Discharge: 2016-04-06 | Disposition: A | Discharge status: Home / Self Care | Payer: Commercial Managed Care - HMO | Source: Ambulatory Visit | Attending: Orthopedic Surgery | Admitting: Orthopedic Surgery

## 2016-04-06 DIAGNOSIS — S4392XA Sprain of unspecified parts of left shoulder girdle, initial encounter: Secondary | ICD-10-CM | POA: Insufficient documentation

## 2016-04-19 DIAGNOSIS — M7582 Other shoulder lesions, left shoulder: Secondary | ICD-10-CM | POA: Insufficient documentation

## 2016-07-19 DIAGNOSIS — E7800 Pure hypercholesterolemia, unspecified: Secondary | ICD-10-CM | POA: Insufficient documentation

## 2016-09-28 DIAGNOSIS — R42 Dizziness and giddiness: Secondary | ICD-10-CM | POA: Insufficient documentation

## 2016-10-18 ENCOUNTER — Inpatient Hospital Stay: Admission: RE | Admit: 2016-10-18 | Payer: 59 | Source: Ambulatory Visit

## 2016-10-19 ENCOUNTER — Inpatient Hospital Stay: Admission: RE | Admit: 2016-10-19 | Payer: 59 | Source: Ambulatory Visit

## 2016-10-20 ENCOUNTER — Inpatient Hospital Stay: Admission: RE | Admit: 2016-10-20 | Payer: 59 | Source: Ambulatory Visit
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
## 2016-10-23 NOTE — Pre-Procedure Instructions (Signed)
[facility]14[facility] Component Name Value Ref Range  Vent Rate (bpm) 95   PR Interval (msec) 136   QRS Interval (msec) 96   QT Interval (msec) 366   QTc (msec) 459   Result Narrative  Sinus rhythm with premature atrial complexes Septal infarct , age undetermined Abnormal ECG No previous ECGs available I reviewed and concur with this report. Electronically signed ZO:XWRUby:VIRK, RAJA ([id number]) on 06/14/2016 8:59:52 AM  Status Results Details    [facility]2016 [facility]")' href="[email or web address]/">Encounter Summary

## 2016-10-23 NOTE — Patient Instructions (Signed)
  Your procedure is scheduled on: 10-26-16 [facility](THURSDAY) Report to Same Day Surgery 2nd floor medical mall To find out your arrival time please call [phone] between 1PM - 3PM on 10-25-16 [facility](WEDNESDAY)  Remember: Instructions that are not followed completely may result in serious medical risk, up to and including death, or upon the discretion of your surgeon and anesthesiologist your surgery may need to be rescheduled.    _x___ 1. Do not eat food or drink liquids after midnight. No gum chewing or hard candies.     __x__ 2. No Alcohol for 24 hours before or after surgery.   __x__3. No Smoking for 24 prior to surgery.   ____  4. Bring all medications with you on the day of surgery if instructed.    __x__ 5. Notify your doctor if there is any change in your medical condition     (cold, fever, infections).     Do not wear jewelry, make-up, hairpins, clips or nail polish.  Do not wear lotions, powders, or perfumes. You may wear deodorant.  Do not shave 48 hours prior to surgery. Men may shave face and neck.  Do not bring valuables to the hospital.    [facility] is not responsible for any belongings or valuables.               Contacts, dentures or bridgework may not be worn into surgery.  Leave your suitcase in the car. After surgery it may be brought to your room.  For patients admitted to the hospital, discharge time is determined by your treatment team.   Patients discharged the day of surgery will not be allowed to drive home.    Please read over the following fact sheets that you were given:   [facility] Preparing [facility] and or MRSA Information   _x___ Take these medicines the morning of surgery with A SIP OF WATER:    1. OXYCODONE  2. MAY TAKE KLONOPIN IF NEEDED AM OF SURGERY  3.  4.  5.  6.  ____Fleets enema or Magnesium Citrate as directed.   _x___ Use CHG Soap or sage wipes as directed on instruction sheet   ____ Use inhalers on the day of surgery and bring to  hospital day of surgery  ____ Stop metformin 2 days prior to surgery    ____ Take 1/2 of usual insulin dose the night before surgery and none on the morning of  surgery.   ____ Stop aspirin or coumadin, or plavix  x__ Stop Anti-inflammatories such as Advil, Aleve, Ibuprofen, Motrin, Naproxen,          Naprosyn, Goodies powders or aspirin products-STOP DICLOFENAC NOW-Ok to CONTINUE OXYCODONE   ____ Stop supplements until after surgery.    ____ Bring C-Pap to the hospital.

## 2016-10-24 ENCOUNTER — Encounter
Admission: RE | Admit: 2016-10-24 | Discharge: 2016-10-24 | Disposition: A | Discharge status: Home / Self Care | Payer: No Typology Code available for payment source | Source: Ambulatory Visit | Attending: Surgery | Admitting: Surgery

## 2016-10-24 DIAGNOSIS — M75112 Incomplete rotator cuff tear or rupture of left shoulder, not specified as traumatic: Secondary | ICD-10-CM | POA: Diagnosis not present

## 2016-10-24 DIAGNOSIS — M47812 Spondylosis without myelopathy or radiculopathy, cervical region: Secondary | ICD-10-CM | POA: Diagnosis not present

## 2016-10-24 DIAGNOSIS — R9431 Abnormal electrocardiogram [ECG] [EKG]: Secondary | ICD-10-CM

## 2016-10-24 DIAGNOSIS — M170 Bilateral primary osteoarthritis of knee: Secondary | ICD-10-CM | POA: Diagnosis not present

## 2016-10-24 DIAGNOSIS — G43909 Migraine, unspecified, not intractable, without status migrainosus: Secondary | ICD-10-CM | POA: Diagnosis not present

## 2016-10-24 DIAGNOSIS — M19042 Primary osteoarthritis, left hand: Secondary | ICD-10-CM | POA: Diagnosis not present

## 2016-10-24 DIAGNOSIS — M7542 Impingement syndrome of left shoulder: Secondary | ICD-10-CM | POA: Diagnosis present

## 2016-10-24 DIAGNOSIS — F419 Anxiety disorder, unspecified: Secondary | ICD-10-CM | POA: Diagnosis not present

## 2016-10-24 DIAGNOSIS — K219 Gastro-esophageal reflux disease without esophagitis: Secondary | ICD-10-CM | POA: Diagnosis not present

## 2016-10-24 DIAGNOSIS — M7022 Olecranon bursitis, left elbow: Secondary | ICD-10-CM | POA: Diagnosis not present

## 2016-10-24 DIAGNOSIS — M19041 Primary osteoarthritis, right hand: Secondary | ICD-10-CM | POA: Diagnosis not present

## 2016-10-24 DIAGNOSIS — M7582 Other shoulder lesions, left shoulder: Secondary | ICD-10-CM | POA: Diagnosis not present

## 2016-10-24 DIAGNOSIS — Z01810 Encounter for preprocedural cardiovascular examination: Secondary | ICD-10-CM | POA: Insufficient documentation

## 2016-10-24 HISTORY — DX: Contusion of heart, unspecified with or without hemopericardium, initial encounter: S26.91XA

## 2016-10-25 ENCOUNTER — Encounter: Payer: Self-pay | Admitting: *Deleted

## 2016-10-25 NOTE — Pre-Procedure Instructions (Signed)
Neurology clearance on chart from Dr Malvin JohnsPotter
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
## 2016-10-26 ENCOUNTER — Ambulatory Visit: Payer: No Typology Code available for payment source | Admitting: Anesthesiology

## 2016-10-26 ENCOUNTER — Encounter: Payer: Self-pay | Admitting: *Deleted

## 2016-10-26 ENCOUNTER — Observation Stay
Admission: RE | Admit: 2016-10-26 | Discharge: 2016-10-27 | Disposition: A | Discharge status: Home / Self Care | Payer: No Typology Code available for payment source | Source: Ambulatory Visit | Attending: Surgery | Admitting: Surgery

## 2016-10-26 ENCOUNTER — Encounter
Admission: RE | Disposition: A | Discharge status: Home / Self Care | Payer: Self-pay | Source: Ambulatory Visit | Attending: Surgery

## 2016-10-26 DIAGNOSIS — M7022 Olecranon bursitis, left elbow: Secondary | ICD-10-CM | POA: Insufficient documentation

## 2016-10-26 DIAGNOSIS — Z9889 Other specified postprocedural states: Secondary | ICD-10-CM

## 2016-10-26 DIAGNOSIS — G43909 Migraine, unspecified, not intractable, without status migrainosus: Secondary | ICD-10-CM | POA: Insufficient documentation

## 2016-10-26 DIAGNOSIS — F419 Anxiety disorder, unspecified: Secondary | ICD-10-CM | POA: Insufficient documentation

## 2016-10-26 DIAGNOSIS — M19042 Primary osteoarthritis, left hand: Secondary | ICD-10-CM | POA: Insufficient documentation

## 2016-10-26 DIAGNOSIS — M7542 Impingement syndrome of left shoulder: Secondary | ICD-10-CM | POA: Diagnosis not present

## 2016-10-26 DIAGNOSIS — M75112 Incomplete rotator cuff tear or rupture of left shoulder, not specified as traumatic: Secondary | ICD-10-CM | POA: Insufficient documentation

## 2016-10-26 DIAGNOSIS — M19041 Primary osteoarthritis, right hand: Secondary | ICD-10-CM | POA: Insufficient documentation

## 2016-10-26 DIAGNOSIS — M47812 Spondylosis without myelopathy or radiculopathy, cervical region: Secondary | ICD-10-CM | POA: Insufficient documentation

## 2016-10-26 DIAGNOSIS — K219 Gastro-esophageal reflux disease without esophagitis: Secondary | ICD-10-CM | POA: Insufficient documentation

## 2016-10-26 DIAGNOSIS — M170 Bilateral primary osteoarthritis of knee: Secondary | ICD-10-CM | POA: Insufficient documentation

## 2016-10-26 DIAGNOSIS — M7582 Other shoulder lesions, left shoulder: Secondary | ICD-10-CM | POA: Insufficient documentation

## 2016-10-26 HISTORY — PX: SHOULDER ARTHROSCOPY WITH ROTATOR CUFF REPAIR: SHX5685

## 2016-10-26 HISTORY — PX: OLECRANON BURSECTOMY: SHX2097

## 2016-10-26 HISTORY — PX: SHOULDER ARTHROSCOPY WITH SUBACROMIAL DECOMPRESSION: SHX5684

## 2016-10-26 SURGERY — BURSECTOMY, ELBOW
Anesthesia: General | Site: Shoulder | Laterality: Left | Wound class: Clean

## 2016-10-26 MED ORDER — LIDOCAINE HCL (PF) 1 % IJ SOLN
INTRAMUSCULAR | Status: AC
  Filled 2016-10-26: qty 5

## 2016-10-26 MED ORDER — LORAZEPAM 2 MG/ML IJ SOLN
INTRAMUSCULAR | Status: AC
  Administered 2016-10-26: 1 mg via INTRAVENOUS
  Filled 2016-10-26: qty 1

## 2016-10-26 MED ORDER — ACETAMINOPHEN 650 MG RE SUPP: 650.0000 mg | Freq: Four times a day (QID) | RECTAL | Status: DC | PRN

## 2016-10-26 MED ORDER — BISACODYL 10 MG RE SUPP: 10.0000 mg | Freq: Every day | RECTAL | Status: DC | PRN

## 2016-10-26 MED ORDER — OXYCODONE HCL 5 MG/5ML PO SOLN: 5.0000 mg | Freq: Once | ORAL | Status: DC | PRN

## 2016-10-26 MED ORDER — METOCLOPRAMIDE HCL 10 MG PO TABS: 5.0000 mg | ORAL_TABLET | Freq: Three times a day (TID) | ORAL | Status: DC | PRN

## 2016-10-26 MED ORDER — MORPHINE SULFATE 30 MG PO TABS: 30.0000 mg | ORAL_TABLET | ORAL | Status: DC | PRN

## 2016-10-26 MED ORDER — SCOPOLAMINE 1 MG/3DAYS TD PT72
1.0000 | MEDICATED_PATCH | Freq: Once | TRANSDERMAL | Status: DC
  Administered 2016-10-26: 1.5 mg via TRANSDERMAL

## 2016-10-26 MED ORDER — CYCLOBENZAPRINE HCL 10 MG PO TABS
10.0000 mg | ORAL_TABLET | Freq: Every day | ORAL | Status: DC
  Administered 2016-10-26: 10 mg via ORAL
  Filled 2016-10-26: qty 1

## 2016-10-26 MED ORDER — ROPIVACAINE HCL 5 MG/ML IJ SOLN
INTRAMUSCULAR | Status: AC
  Filled 2016-10-26: qty 40

## 2016-10-26 MED ORDER — MIDAZOLAM HCL 2 MG/2ML IJ SOLN
1.0000 mg | Freq: Once | INTRAMUSCULAR | Status: AC
  Administered 2016-10-26: 2 mg via INTRAVENOUS

## 2016-10-26 MED ORDER — BUPIVACAINE-EPINEPHRINE (PF) 0.5% -1:200000 IJ SOLN
INTRAMUSCULAR | Status: AC
  Filled 2016-10-26: qty 30

## 2016-10-26 MED ORDER — EPINEPHRINE PF 1 MG/ML IJ SOLN
INTRAMUSCULAR | Status: DC | PRN
  Administered 2016-10-26: 2 mL

## 2016-10-26 MED ORDER — FLEET ENEMA 7-19 GM/118ML RE ENEM: 1.0000 | ENEMA | Freq: Once | RECTAL | Status: DC | PRN

## 2016-10-26 MED ORDER — HYDROMORPHONE HCL 1 MG/ML IJ SOLN
INTRAMUSCULAR | Status: AC
  Administered 2016-10-26: 0.5 mg via INTRAVENOUS
  Filled 2016-10-26: qty 1

## 2016-10-26 MED ORDER — PROPOFOL 10 MG/ML IV BOLUS
INTRAVENOUS | Status: DC | PRN
  Administered 2016-10-26: 180 mg via INTRAVENOUS

## 2016-10-26 MED ORDER — HYDROMORPHONE HCL 1 MG/ML IJ SOLN
1.0000 mg | INTRAMUSCULAR | Status: DC | PRN
  Administered 2016-10-26: 2 mg via INTRAVENOUS
  Filled 2016-10-26: qty 2

## 2016-10-26 MED ORDER — PHENYLEPHRINE HCL 10 MG/ML IJ SOLN
INTRAMUSCULAR | Status: DC | PRN
  Administered 2016-10-26: 100 ug via INTRAVENOUS

## 2016-10-26 MED ORDER — MEPERIDINE HCL 25 MG/ML IJ SOLN: 6.2500 mg | INTRAMUSCULAR | Status: DC | PRN

## 2016-10-26 MED ORDER — GABAPENTIN 300 MG PO CAPS
600.0000 mg | ORAL_CAPSULE | Freq: Every day | ORAL | Status: DC
  Administered 2016-10-26: 600 mg via ORAL
  Filled 2016-10-26: qty 2

## 2016-10-26 MED ORDER — KETOROLAC TROMETHAMINE 15 MG/ML IJ SOLN
15.0000 mg | Freq: Four times a day (QID) | INTRAMUSCULAR | Status: DC
  Administered 2016-10-26 – 2016-10-27 (×3): 15 mg via INTRAVENOUS
  Filled 2016-10-26 (×3): qty 1

## 2016-10-26 MED ORDER — KETOROLAC TROMETHAMINE 30 MG/ML IJ SOLN
INTRAMUSCULAR | Status: AC
  Administered 2016-10-26: 30 mg via INTRAVENOUS
  Filled 2016-10-26: qty 1

## 2016-10-26 MED ORDER — FAMOTIDINE 20 MG PO TABS
20.0000 mg | ORAL_TABLET | Freq: Once | ORAL | Status: AC
  Administered 2016-10-26: 20 mg via ORAL

## 2016-10-26 MED ORDER — NEOMYCIN-POLYMYXIN B GU 40-200000 IR SOLN
Status: DC | PRN
  Administered 2016-10-26: 2 mL

## 2016-10-26 MED ORDER — ROCURONIUM BROMIDE 100 MG/10ML IV SOLN
INTRAVENOUS | Status: DC | PRN
  Administered 2016-10-26: 35 mg via INTRAVENOUS

## 2016-10-26 MED ORDER — PROMETHAZINE HCL 25 MG/ML IJ SOLN: 6.2500 mg | INTRAMUSCULAR | Status: DC | PRN

## 2016-10-26 MED ORDER — METOCLOPRAMIDE HCL 5 MG/ML IJ SOLN: 5.0000 mg | Freq: Three times a day (TID) | INTRAMUSCULAR | Status: DC | PRN

## 2016-10-26 MED ORDER — KETOROLAC TROMETHAMINE 30 MG/ML IJ SOLN
30.0000 mg | Freq: Once | INTRAMUSCULAR | Status: AC
  Administered 2016-10-26: 30 mg via INTRAVENOUS

## 2016-10-26 MED ORDER — CEFAZOLIN SODIUM-DEXTROSE 2-4 GM/100ML-% IV SOLN
INTRAVENOUS | Status: AC
  Filled 2016-10-26: qty 100

## 2016-10-26 MED ORDER — KETOROLAC TROMETHAMINE 30 MG/ML IJ SOLN
INTRAMUSCULAR | Status: AC
  Filled 2016-10-26: qty 1

## 2016-10-26 MED ORDER — SCOPOLAMINE 1 MG/3DAYS TD PT72
MEDICATED_PATCH | TRANSDERMAL | Status: AC
  Filled 2016-10-26: qty 1

## 2016-10-26 MED ORDER — ACETAMINOPHEN 10 MG/ML IV SOLN
INTRAVENOUS | Status: DC | PRN
  Administered 2016-10-26: 1000 mg via INTRAVENOUS

## 2016-10-26 MED ORDER — EPINEPHRINE PF 1 MG/ML IJ SOLN
INTRAMUSCULAR | Status: AC
  Filled 2016-10-26: qty 2

## 2016-10-26 MED ORDER — OXYCODONE HCL 5 MG PO TABS: 5.0000 mg | ORAL_TABLET | Freq: Once | ORAL | Status: DC | PRN

## 2016-10-26 MED ORDER — ACETAMINOPHEN 10 MG/ML IV SOLN
INTRAVENOUS | Status: AC
  Filled 2016-10-26: qty 100

## 2016-10-26 MED ORDER — NEOSTIGMINE METHYLSULFATE 10 MG/10ML IV SOLN
INTRAVENOUS | Status: DC | PRN
  Administered 2016-10-26: 3 mg via INTRAVENOUS

## 2016-10-26 MED ORDER — ONDANSETRON HCL 4 MG/2ML IJ SOLN
INTRAMUSCULAR | Status: DC | PRN
  Administered 2016-10-26: 4 mg via INTRAVENOUS

## 2016-10-26 MED ORDER — FAMOTIDINE 20 MG PO TABS
ORAL_TABLET | ORAL | Status: AC
  Filled 2016-10-26: qty 1

## 2016-10-26 MED ORDER — PHENYLEPHRINE HCL 10 MG/ML IJ SOLN
INTRAVENOUS | Status: DC | PRN
  Administered 2016-10-26: 20 ug/min via INTRAVENOUS

## 2016-10-26 MED ORDER — ONDANSETRON HCL 4 MG/2ML IJ SOLN: 4.0000 mg | Freq: Four times a day (QID) | INTRAMUSCULAR | Status: DC | PRN

## 2016-10-26 MED ORDER — KCL IN DEXTROSE-NACL 20-5-0.9 MEQ/L-%-% IV SOLN
INTRAVENOUS | Status: DC
  Administered 2016-10-26 – 2016-10-27 (×2): via INTRAVENOUS
  Filled 2016-10-26 (×4): qty 1000

## 2016-10-26 MED ORDER — MAGNESIUM HYDROXIDE 400 MG/5ML PO SUSP: 30.0000 mL | Freq: Every day | ORAL | Status: DC | PRN

## 2016-10-26 MED ORDER — MORPHINE SULFATE (PF) 2 MG/ML IV SOLN
2.0000 mg | INTRAVENOUS | Status: AC | PRN
Start: 2016-10-26 — End: 2016-10-26
  Administered 2016-10-26: 2 mg via INTRAVENOUS
  Filled 2016-10-26: qty 1

## 2016-10-26 MED ORDER — HYDROMORPHONE HCL 1 MG/ML IJ SOLN
0.2500 mg | INTRAMUSCULAR | Status: DC | PRN
  Administered 2016-10-26 (×2): 0.5 mg via INTRAVENOUS

## 2016-10-26 MED ORDER — DEXAMETHASONE SODIUM PHOSPHATE 4 MG/ML IJ SOLN
INTRAMUSCULAR | Status: DC | PRN
  Administered 2016-10-26: 10 mg via INTRAVENOUS

## 2016-10-26 MED ORDER — LACTATED RINGERS IV SOLN
INTRAVENOUS | Status: DC
  Administered 2016-10-26 (×2): via INTRAVENOUS

## 2016-10-26 MED ORDER — BUPIVACAINE-EPINEPHRINE 0.5% -1:200000 IJ SOLN
INTRAMUSCULAR | Status: DC | PRN
  Administered 2016-10-26: 25 mL

## 2016-10-26 MED ORDER — DOCUSATE SODIUM 100 MG PO CAPS
100.0000 mg | ORAL_CAPSULE | Freq: Two times a day (BID) | ORAL | Status: DC
  Administered 2016-10-26 – 2016-10-27 (×3): 100 mg via ORAL
  Filled 2016-10-26 (×3): qty 1

## 2016-10-26 MED ORDER — LIDOCAINE HCL (PF) 4 % IJ SOLN
INTRAMUSCULAR | Status: DC | PRN
  Administered 2016-10-26: 4 mL via RESPIRATORY_TRACT

## 2016-10-26 MED ORDER — LIDOCAINE HCL (CARDIAC) 20 MG/ML IV SOLN
INTRAVENOUS | Status: DC | PRN
  Administered 2016-10-26 (×2): 100 mg via INTRAVENOUS

## 2016-10-26 MED ORDER — ADULT MULTIVITAMIN W/MINERALS CH
1.0000 | ORAL_TABLET | Freq: Every day | ORAL | Status: DC
  Administered 2016-10-26 – 2016-10-27 (×2): 1 via ORAL
  Filled 2016-10-26 (×2): qty 1

## 2016-10-26 MED ORDER — PRAVASTATIN SODIUM 20 MG PO TABS
20.0000 mg | ORAL_TABLET | Freq: Every day | ORAL | Status: DC
  Administered 2016-10-26: 20 mg via ORAL
  Filled 2016-10-26: qty 1

## 2016-10-26 MED ORDER — FENTANYL CITRATE (PF) 100 MCG/2ML IJ SOLN
25.0000 ug | INTRAMUSCULAR | Status: DC | PRN
  Administered 2016-10-26 (×2): 50 ug via INTRAVENOUS

## 2016-10-26 MED ORDER — VENLAFAXINE HCL 37.5 MG PO TABS
75.0000 mg | ORAL_TABLET | Freq: Every day | ORAL | Status: DC
  Administered 2016-10-26: 75 mg via ORAL
  Filled 2016-10-26: qty 2

## 2016-10-26 MED ORDER — GLYCOPYRROLATE 0.2 MG/ML IJ SOLN
INTRAMUSCULAR | Status: DC | PRN
  Administered 2016-10-26: 0.4 mg via INTRAVENOUS

## 2016-10-26 MED ORDER — MIDAZOLAM HCL 5 MG/5ML IJ SOLN
INTRAMUSCULAR | Status: AC
  Administered 2016-10-26: 2 mg via INTRAVENOUS
  Filled 2016-10-26: qty 5

## 2016-10-26 MED ORDER — DIPHENHYDRAMINE HCL 12.5 MG/5ML PO ELIX: 12.5000 mg | ORAL_SOLUTION | ORAL | Status: DC | PRN

## 2016-10-26 MED ORDER — MIDAZOLAM HCL 2 MG/2ML IJ SOLN
2.0000 mg | Freq: Once | INTRAMUSCULAR | Status: AC
  Administered 2016-10-26: 1 mg via INTRAVENOUS

## 2016-10-26 MED ORDER — NEOMYCIN-POLYMYXIN B GU 40-200000 IR SOLN
Status: AC
  Filled 2016-10-26: qty 2

## 2016-10-26 MED ORDER — SUCCINYLCHOLINE CHLORIDE 20 MG/ML IJ SOLN
INTRAMUSCULAR | Status: DC | PRN
  Administered 2016-10-26: 100 mg via INTRAVENOUS

## 2016-10-26 MED ORDER — ROPIVACAINE HCL 5 MG/ML IJ SOLN
INTRAMUSCULAR | Status: DC | PRN
  Administered 2016-10-26: 30 mL via PERINEURAL

## 2016-10-26 MED ORDER — CEFAZOLIN SODIUM-DEXTROSE 2-4 GM/100ML-% IV SOLN: 2.0000 g | Freq: Once | INTRAVENOUS | Status: DC

## 2016-10-26 MED ORDER — ONDANSETRON HCL 4 MG PO TABS: 4.0000 mg | ORAL_TABLET | Freq: Four times a day (QID) | ORAL | Status: DC | PRN

## 2016-10-26 MED ORDER — FENTANYL CITRATE (PF) 100 MCG/2ML IJ SOLN
INTRAMUSCULAR | Status: DC | PRN
  Administered 2016-10-26 (×2): 100 ug via INTRAVENOUS
  Administered 2016-10-26: 50 ug via INTRAVENOUS

## 2016-10-26 MED ORDER — ACETAMINOPHEN 325 MG PO TABS: 650.0000 mg | ORAL_TABLET | Freq: Four times a day (QID) | ORAL | Status: DC | PRN

## 2016-10-26 MED ORDER — FLUTICASONE PROPIONATE 50 MCG/ACT NA SUSP
1.0000 | Freq: Every day | NASAL | Status: DC
Start: 2016-10-27 — End: 2016-10-27
  Administered 2016-10-27: 1 via NASAL
  Filled 2016-10-26: qty 16

## 2016-10-26 MED ORDER — FENTANYL CITRATE (PF) 100 MCG/2ML IJ SOLN
INTRAMUSCULAR | Status: AC
  Administered 2016-10-26: 50 ug via INTRAVENOUS
  Filled 2016-10-26: qty 2

## 2016-10-26 MED ORDER — CLONAZEPAM 0.125 MG PO TBDP
0.2500 mg | ORAL_TABLET | Freq: Two times a day (BID) | ORAL | Status: DC | PRN
  Administered 2016-10-27: 0.25 mg via ORAL
  Filled 2016-10-26: qty 2

## 2016-10-26 MED ORDER — OXYCODONE HCL 5 MG PO TABS
10.0000 mg | ORAL_TABLET | ORAL | Status: DC | PRN
  Administered 2016-10-26 – 2016-10-27 (×2): 10 mg via ORAL
  Filled 2016-10-26 (×2): qty 2

## 2016-10-26 MED ORDER — LORAZEPAM 2 MG/ML IJ SOLN
1.0000 mg | INTRAMUSCULAR | Status: AC | PRN
  Administered 2016-10-26 (×2): 1 mg via INTRAVENOUS

## 2016-10-26 SURGICAL SUPPLY — 76 items
ANCH SUT 2 2/0 ABS BRD STRL (Anchor) ×4 IMPLANT
ANCHOR SUT W/ ORTHOCORD (Anchor) ×15 IMPLANT
BANDAGE ELASTIC 3 CLIP ST LF (GAUZE/BANDAGES/DRESSINGS) ×3 IMPLANT
BIT DRILL JUGRKNT W/NDL BIT2.9 (DRILL) ×6 IMPLANT
BLADE FULL RADIUS 3.5 (BLADE) ×6 IMPLANT
BLADE SHAVER 4.5X7 STR FR (MISCELLANEOUS) ×3 IMPLANT
BNDG COHESIVE 4X5 TAN STRL (GAUZE/BANDAGES/DRESSINGS) ×6 IMPLANT
BNDG ESMARK 4X12 TAN STRL LF (GAUZE/BANDAGES/DRESSINGS) ×6 IMPLANT
BUR ACROMIONIZER 4.0 (BURR) ×6 IMPLANT
BUR BR 5.5 WIDE MOUTH (BURR) ×3 IMPLANT
CANISTER SUCT 1200ML W/VALVE (MISCELLANEOUS) ×6 IMPLANT
CANNULA SHAVER 8MMX76MM (CANNULA) ×9 IMPLANT
CHLORAPREP W/TINT 26ML (MISCELLANEOUS) ×12 IMPLANT
CLOSURE WOUND 1/2 X4 (GAUZE/BANDAGES/DRESSINGS)
COVER MAYO STAND STRL (DRAPES) ×6 IMPLANT
CUFF TOURN 18 STER (MISCELLANEOUS) IMPLANT
CUFF TOURN 24 STER (MISCELLANEOUS) IMPLANT
DRAPE IMP U-DRAPE 54X76 (DRAPES) ×12 IMPLANT
DRAPE SHEET LG 3/4 BI-LAMINATE (DRAPES) ×6 IMPLANT
DRILL JUGGERKNOT W/NDL BIT 2.9 (DRILL)
DRSG OPSITE POSTOP 4X8 (GAUZE/BANDAGES/DRESSINGS) ×6 IMPLANT
ELECT CAUTERY BLADE 6.4 (BLADE) ×6 IMPLANT
ELECT REM PT RETURN 9FT ADLT (ELECTROSURGICAL) ×6
ELECTRODE REM PT RTRN 9FT ADLT (ELECTROSURGICAL) ×4 IMPLANT
GAUZE PETRO XEROFOAM 1X8 (MISCELLANEOUS) ×6 IMPLANT
GAUZE SPONGE 4X4 12PLY STRL (GAUZE/BANDAGES/DRESSINGS) ×6 IMPLANT
GLOVE BIO SURGEON STRL SZ7.5 (GLOVE) ×12 IMPLANT
GLOVE BIO SURGEON STRL SZ8 (GLOVE) ×12 IMPLANT
GLOVE BIOGEL M 7.0 STRL (GLOVE) ×12 IMPLANT
GLOVE BIOGEL PI IND STRL 7.5 (GLOVE) ×4 IMPLANT
GLOVE BIOGEL PI IND STRL 8 (GLOVE) ×4 IMPLANT
GLOVE BIOGEL PI INDICATOR 7.5 (GLOVE) ×2
GLOVE BIOGEL PI INDICATOR 8 (GLOVE) ×2
GLOVE INDICATOR 8.0 STRL GRN (GLOVE) ×12 IMPLANT
GOWN STRL REUS W/ TWL LRG LVL3 (GOWN DISPOSABLE) ×8 IMPLANT
GOWN STRL REUS W/ TWL XL LVL3 (GOWN DISPOSABLE) ×4 IMPLANT
GOWN STRL REUS W/TWL LRG LVL3 (GOWN DISPOSABLE) ×12
GOWN STRL REUS W/TWL XL LVL3 (GOWN DISPOSABLE) ×6
GRASPER SUT 15 45D LOW PRO (SUTURE) ×9 IMPLANT
IV LACTATED RINGER IRRG 3000ML (IV SOLUTION) ×12
IV LR IRRIG 3000ML ARTHROMATIC (IV SOLUTION) ×8 IMPLANT
KIT RM TURNOVER STRD PROC AR (KITS) ×6 IMPLANT
MANIFOLD NEPTUNE II (INSTRUMENTS) ×6 IMPLANT
MASK FACE SPIDER DISP (MASK) ×6 IMPLANT
MAT BLUE FLOOR 46X72 FLO (MISCELLANEOUS) ×6 IMPLANT
NDL FILTER BLUNT 18X1 1/2 (NEEDLE) ×3 IMPLANT
NDL REVERSE CUT 1/2 CRC (NEEDLE) ×3 IMPLANT
NEEDLE FILTER BLUNT 18X 1/2SAF (NEEDLE) ×2
NEEDLE FILTER BLUNT 18X1 1/2 (NEEDLE) ×4 IMPLANT
NEEDLE REVERSE CUT 1/2 CRC (NEEDLE) IMPLANT
NS IRRIG 500ML POUR BTL (IV SOLUTION) ×6 IMPLANT
PACK ARTHROSCOPY SHOULDER (MISCELLANEOUS) ×6 IMPLANT
PACK EXTREMITY ARMC (MISCELLANEOUS) ×6 IMPLANT
PAD ABD DERMACEA PRESS 5X9 (GAUZE/BANDAGES/DRESSINGS) ×6 IMPLANT
PAD CAST CTTN 4X4 STRL (SOFTGOODS) ×4 IMPLANT
PAD WRAPON POLAR SHDR UNIV (MISCELLANEOUS) ×1 IMPLANT
PADDING CAST COTTON 4X4 STRL (SOFTGOODS) ×6
SLING ARM LRG DEEP (SOFTGOODS) ×3 IMPLANT
SLING ULTRA II LG (MISCELLANEOUS) ×6 IMPLANT
SPONGE LAP 18X18 5 PK (GAUZE/BANDAGES/DRESSINGS) ×3 IMPLANT
STAPLER SKIN PROX 35W (STAPLE) ×6 IMPLANT
STOCKINETTE IMPERVIOUS 9X36 MD (GAUZE/BANDAGES/DRESSINGS) ×6 IMPLANT
STRAP SAFETY BODY (MISCELLANEOUS) ×6 IMPLANT
STRIP CLOSURE SKIN 1/2X4 (GAUZE/BANDAGES/DRESSINGS) ×3 IMPLANT
SUT ETHIBOND 0 MO6 C/R (SUTURE) ×6 IMPLANT
SUT VIC AB 2-0 CT1 27 (SUTURE) ×12
SUT VIC AB 2-0 CT1 TAPERPNT 27 (SUTURE) ×8 IMPLANT
SUT VIC AB 2-0 SH 27 (SUTURE) ×6
SUT VIC AB 2-0 SH 27XBRD (SUTURE) ×4 IMPLANT
SUT VIC AB 3-0 PS2 18 (SUTURE) ×3 IMPLANT
TAPE MICROFOAM 4IN (TAPE) ×6 IMPLANT
TUBING ARTHRO INFLOW-ONLY STRL (TUBING) ×6 IMPLANT
TUBING CONNECTING 10 (TUBING) ×5 IMPLANT
TUBING CONNECTING 10' (TUBING) ×1
WAND HAND CNTRL MULTIVAC 90 (MISCELLANEOUS) ×6 IMPLANT
WRAPON POLAR PAD SHDR UNIV (MISCELLANEOUS) ×6

## 2016-10-26 NOTE — Transfer of Care (Signed)
Immediate Anesthesia Transfer of Care Note  Patient: Chad NeitherGary D Acosta  Procedure(s) Performed: Procedure(s): OLECRANON BURSA EXCISION (Left) SHOULDER ARTHROSCOPY WITH ROTATOR CUFF REPAIR AND SUBSCAPULARIS REPAIR (Left) SHOULDER ARTHROSCOPY WITH SUBACROMIAL DECOMPRESSION  Patient Location: PACU  Anesthesia Type:General  Level of Consciousness: awake and patient cooperative  Airway & Oxygen Therapy: Patient Spontanous Breathing and Patient connected to nasal cannula oxygen  Post-op Assessment: Report given to RN and Post -op Vital signs reviewed and stable  Post vital signs: Reviewed and stable  Last Vitals:  Vitals:   10/26/16 1129 10/26/16 1139  BP: 115/82   Pulse: 90 (!) 101  Resp: 12 19  Temp:      Last Pain:  Vitals:   10/26/16 1129  TempSrc:   PainSc: 4          Complications: No apparent anesthesia complications
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
## 2016-10-26 NOTE — Anesthesia Procedure Notes (Signed)
Procedure Name: Intubation Date/Time: 10/26/2016 11:50 AM Performed by: Shirlee LimerickMARION, Violet Cart Pre-anesthesia Checklist: Patient identified, Emergency Drugs available, Suction available and Patient being monitored Patient Re-evaluated:Patient Re-evaluated prior to inductionOxygen Delivery Method: Circle system utilized Preoxygenation: Pre-oxygenation with 100% oxygen Intubation Type: IV induction Laryngoscope Size: 3 and McGraph Grade View: Grade II Tube size: 7.0 mm Number of attempts: 1 Airway Equipment and Method: Bougie stylet Placement Confirmation: ETT inserted through vocal cords under direct vision,  positive ETCO2 and breath sounds checked- equal and bilateral Secured at: 23 cm Tube secured with: Tape Dental Injury: Teeth and Oropharynx as per pre-operative assessment  Future Recommendations: Recommend- induction with short-acting agent, and alternative techniques readily available and Recommend- awake intubation

## 2016-10-26 NOTE — Anesthesia Procedure Notes (Addendum)
Anesthesia Regional Block:  Interscalene brachial plexus block  Pre-Anesthetic Checklist: ,, timeout performed, Correct Patient, Correct Site, Correct Laterality, Correct Procedure, Correct Position, site marked, Risks and benefits discussed,  Surgical consent,  Pre-op evaluation,  At surgeon's request and post-op pain management  Laterality: Left  Prep: chloraprep       Needles:  Injection technique: Single-shot  Needle Type: Stimiplex     Needle Length: 9cm 9 cm Needle Gauge: 22 and 22 G    Additional Needles:  Procedures: ultrasound guided (picture in chart) Interscalene brachial plexus block Narrative:  Start time: 10/26/2016 10:50 AM End time: 10/26/2016 11:05 AM Injection made incrementally with aspirations every 5 mL.  Performed by: Personally  Anesthesiologist: Octivia Canion  Additional Notes: Functioning IV was confirmed and monitors were applied.  A 50mm 22ga Stimuplex needle was used. Sterile prep and drape,hand hygiene and sterile gloves were used.  Negative aspiration and negative test dose prior to incremental administration of local anesthetic. The patient tolerated the procedure well.

## 2016-10-26 NOTE — Progress Notes (Signed)
Patient taken to PACU bay 2 for block.

## 2016-10-26 NOTE — Anesthesia Postprocedure Evaluation (Signed)
Anesthesia Post Note  Patient: Chad NeitherGary D Acosta  Procedure(s) Performed: Procedure(s) (LRB): OLECRANON BURSA EXCISION (Left) SHOULDER ARTHROSCOPY WITH ROTATOR CUFF REPAIR AND SUBSCAPULARIS REPAIR (Left) SHOULDER ARTHROSCOPY WITH SUBACROMIAL DECOMPRESSION  Patient location during evaluation: PACU Anesthesia Type: General Level of consciousness: awake and alert and oriented Pain management: pain level controlled Vital Signs Assessment: post-procedure vital signs reviewed and stable Respiratory status: spontaneous breathing, nonlabored ventilation and respiratory function stable Cardiovascular status: blood pressure returned to baseline and stable Postop Assessment: no signs of nausea or vomiting Anesthetic complications: no    Last Vitals:  Vitals:   10/26/16 1441 10/26/16 1456  BP: 138/84 131/89  Pulse: 77 78  Resp: 11 16  Temp: 36.2 C     Last Pain:  Vitals:   10/26/16 1456  TempSrc:   PainSc: Asleep                 Celie Desrochers
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
## 2016-10-26 NOTE — H&P (Signed)
Paper H&P to be scanned into permanent record. H&P reviewed. No changes. 

## 2016-10-26 NOTE — Discharge Instructions (Signed)
Keep dressing dry and intact.  May shower after dressing changed on post-op day #4 (Monday).  Cover staples with Band-Aids after drying off. Apply ice frequently to shoulder or use polar care device. Take ibuprofen 800 mg TID with meals for 7-10 days, then as necessary. Take usual pain medication as prescribed when needed.  Keep shoulder immobilizer on at all times except may remove for bathing purposes. Follow-up in 10-14 days or as scheduled.
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
## 2016-10-27 DIAGNOSIS — M75112 Incomplete rotator cuff tear or rupture of left shoulder, not specified as traumatic: Secondary | ICD-10-CM | POA: Insufficient documentation

## 2016-10-27 DIAGNOSIS — M7542 Impingement syndrome of left shoulder: Secondary | ICD-10-CM | POA: Diagnosis not present

## 2016-10-27 NOTE — Plan of Care (Signed)
Problem: Education: Goal: Knowledge of [facility] Education information/materials will improve Outcome: Progressing Free of falls during shift.  Pt reports pain 7-10/10 shoulder pain, received scheduled Toradol + PRN IV Dilaudid.  Pain not well-controlled s/p PRN Dilaudid, per pt request, Dr. Rosita KeaMenz paged, pt received ordered IV Morphine 2mg .  Pt able to sleep during shift.  Ambulated to BR multiple times during shift.  Call bell within reach, WCTM.
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
## 2016-10-27 NOTE — Progress Notes (Signed)
[name] to be D/C'd Home per MD order.  Discussed with the patient and all questions fully answered.  VSS, Skin clean, dry and intact without evidence of skin break down, no evidence of skin tears noted. IV catheter discontinued intact. Site without signs and symptoms of complications. Dressing and pressure applied.  An After Visit Summary was printed and given to the patient. Patient received prescription.  D/c education completed with patient/family including follow up instructions, medication list, d/c activities limitations if indicated, with other d/c instructions as indicated by MD - patient able to verbalize understanding, all questions fully answered.   Patient instructed to return to ED, call 911, or call MD for any changes in condition.   Patient escorted via WC, and D/C home via private auto.  [name] 10/27/2016 10:53 AM

## 2016-12-25 ENCOUNTER — Other Ambulatory Visit: Payer: Self-pay | Admitting: Surgery

## 2016-12-25 DIAGNOSIS — M7582 Other shoulder lesions, left shoulder: Secondary | ICD-10-CM

## 2016-12-25 DIAGNOSIS — M75112 Incomplete rotator cuff tear or rupture of left shoulder, not specified as traumatic: Secondary | ICD-10-CM

## 2017-01-02 ENCOUNTER — Ambulatory Visit
Admission: RE | Admit: 2017-01-02 | Discharge: 2017-01-02 | Disposition: A | Discharge status: Home / Self Care | Payer: No Typology Code available for payment source | Source: Ambulatory Visit | Attending: Surgery | Admitting: Surgery

## 2017-01-02 DIAGNOSIS — Z9889 Other specified postprocedural states: Secondary | ICD-10-CM | POA: Diagnosis not present

## 2017-01-02 DIAGNOSIS — M75112 Incomplete rotator cuff tear or rupture of left shoulder, not specified as traumatic: Secondary | ICD-10-CM

## 2017-01-02 DIAGNOSIS — M7582 Other shoulder lesions, left shoulder: Secondary | ICD-10-CM | POA: Insufficient documentation

## 2017-03-01 ENCOUNTER — Emergency Department
Admission: EM | Admit: 2017-03-01 | Discharge: 2017-03-01 | Disposition: A | Discharge status: Home / Self Care | Payer: Self-pay | Attending: Emergency Medicine | Admitting: Emergency Medicine

## 2017-03-01 ENCOUNTER — Encounter: Payer: Self-pay | Admitting: Emergency Medicine

## 2017-03-01 ENCOUNTER — Emergency Department: Payer: Self-pay

## 2017-03-01 DIAGNOSIS — R002 Palpitations: Secondary | ICD-10-CM | POA: Insufficient documentation

## 2017-03-01 DIAGNOSIS — Z5321 Procedure and treatment not carried out due to patient leaving prior to being seen by health care provider: Secondary | ICD-10-CM | POA: Insufficient documentation

## 2017-03-01 DIAGNOSIS — Z87891 Personal history of nicotine dependence: Secondary | ICD-10-CM | POA: Insufficient documentation

## 2017-03-01 LAB — CBC
HCT: 44.7 % (ref 40.0–52.0)
HEMOGLOBIN: 15.1 g/dL (ref 13.0–18.0)
MCH: 29.4 pg (ref 26.0–34.0)
MCHC: 33.8 g/dL (ref 32.0–36.0)
MCV: 87.2 fL (ref 80.0–100.0)
Platelets: 308 10*3/uL (ref 150–440)
RBC: 5.13 MIL/uL (ref 4.40–5.90)
RDW: 13.1 % (ref 11.5–14.5)
WBC: 8.8 10*3/uL (ref 3.8–10.6)

## 2017-03-01 LAB — BASIC METABOLIC PANEL
ANION GAP: 9 (ref 5–15)
BUN: <5 mg/dL — ABNORMAL LOW (ref 6–20)
CHLORIDE: 96 mmol/L — AB (ref 101–111)
CO2: 31 mmol/L (ref 22–32)
Calcium: 9 mg/dL (ref 8.9–10.3)
Creatinine, Ser: 0.79 mg/dL (ref 0.61–1.24)
GFR calc non Af Amer: >60 mL/min (ref >60)
Glucose, Bld: 98 mg/dL (ref 65–99)
Potassium: 3.8 mmol/L (ref 3.5–5.1)
Sodium: 136 mmol/L (ref 135–145)

## 2017-03-01 LAB — TROPONIN I: Troponin I: <0.03 ng/mL (ref <0.03)

## 2017-03-01 NOTE — ED Triage Notes (Signed)
Pt's HR noted to range from 59 while quiet to 120 while talking about reason for visit.

## 2017-03-01 NOTE — ED Notes (Signed)
Patient transported to CT 
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
## 2017-03-12 ENCOUNTER — Other Ambulatory Visit: Payer: Self-pay | Admitting: Surgery

## 2017-03-12 DIAGNOSIS — M75112 Incomplete rotator cuff tear or rupture of left shoulder, not specified as traumatic: Secondary | ICD-10-CM

## 2017-03-12 DIAGNOSIS — M7582 Other shoulder lesions, left shoulder: Secondary | ICD-10-CM

## 2017-03-12 DIAGNOSIS — Z9889 Other specified postprocedural states: Secondary | ICD-10-CM

## 2017-03-12 DIAGNOSIS — M4722 Other spondylosis with radiculopathy, cervical region: Secondary | ICD-10-CM | POA: Insufficient documentation

## 2017-03-14 ENCOUNTER — Other Ambulatory Visit: Payer: Self-pay | Admitting: Surgery

## 2017-03-14 DIAGNOSIS — M4722 Other spondylosis with radiculopathy, cervical region: Secondary | ICD-10-CM

## 2017-03-27 ENCOUNTER — Ambulatory Visit
Admission: RE | Admit: 2017-03-27 | Discharge: 2017-03-27 | Disposition: A | Discharge status: Home / Self Care | Payer: No Typology Code available for payment source | Source: Ambulatory Visit | Attending: Surgery | Admitting: Surgery

## 2017-03-27 DIAGNOSIS — M4722 Other spondylosis with radiculopathy, cervical region: Secondary | ICD-10-CM | POA: Diagnosis present

## 2017-03-27 DIAGNOSIS — M4802 Spinal stenosis, cervical region: Secondary | ICD-10-CM | POA: Diagnosis not present

## 2017-03-27 DIAGNOSIS — Z9889 Other specified postprocedural states: Secondary | ICD-10-CM

## 2017-03-27 DIAGNOSIS — M75112 Incomplete rotator cuff tear or rupture of left shoulder, not specified as traumatic: Secondary | ICD-10-CM

## 2017-03-27 DIAGNOSIS — M7582 Other shoulder lesions, left shoulder: Secondary | ICD-10-CM

## 2017-03-27 DIAGNOSIS — M50221 Other cervical disc displacement at C4-C5 level: Secondary | ICD-10-CM | POA: Diagnosis not present

## 2017-03-27 DIAGNOSIS — M2578 Osteophyte, vertebrae: Secondary | ICD-10-CM | POA: Insufficient documentation

## 2017-03-30 ENCOUNTER — Ambulatory Visit: Payer: No Typology Code available for payment source

## 2017-04-06 ENCOUNTER — Ambulatory Visit
Admission: RE | Admit: 2017-04-06 | Discharge: 2017-04-06 | Disposition: A | Discharge status: Home / Self Care | Payer: No Typology Code available for payment source | Source: Ambulatory Visit | Attending: Surgery | Admitting: Surgery

## 2017-04-06 DIAGNOSIS — M7582 Other shoulder lesions, left shoulder: Secondary | ICD-10-CM | POA: Insufficient documentation

## 2017-04-06 DIAGNOSIS — Z9889 Other specified postprocedural states: Secondary | ICD-10-CM

## 2017-04-06 DIAGNOSIS — M75112 Incomplete rotator cuff tear or rupture of left shoulder, not specified as traumatic: Secondary | ICD-10-CM

## 2017-04-06 MED ORDER — GADOBENATE DIMEGLUMINE 529 MG/ML IV SOLN
0.0100 mL | Freq: Once | INTRAVENOUS | Status: AC | PRN
Start: 2017-04-06 — End: 2017-04-06
  Administered 2017-04-06: 0.1 mL via INTRA_ARTICULAR

## 2017-04-06 MED ORDER — LIDOCAINE HCL (PF) 1 % IJ SOLN
5.0000 mL | Freq: Once | INTRAMUSCULAR | Status: AC
  Administered 2017-04-06: 5 mL
  Filled 2017-04-06: qty 5

## 2017-04-06 MED ORDER — IOPAMIDOL (ISOVUE-200) INJECTION 41%
15.0000 mL | Freq: Once | INTRAVENOUS | Status: AC
  Administered 2017-04-06: 15 mL
  Filled 2017-04-06: qty 1

## 2017-09-03 ENCOUNTER — Ambulatory Visit: Payer: Self-pay | Admitting: Pharmacy Technician

## 2017-09-03 ENCOUNTER — Encounter (INDEPENDENT_AMBULATORY_CARE_PROVIDER_SITE_OTHER): Payer: Self-pay

## 2017-09-03 DIAGNOSIS — Z79899 Other long term (current) drug therapy: Secondary | ICD-10-CM

## 2017-09-05 NOTE — Progress Notes (Signed)
Completed [facility] application and contract.  Patient agreed to all terms of the [facility] contract.  Patient approved to receive medication assistance through 2018, as long as eligibility criteria continues to be met.  Provided patient with community resource material based on his particular needs.    Referred patient for MTM.  Assisted patient with the completion of [facility] Charity Care Application.  Patient is to be responsible for the collection of financial information and sending along with completed application to [facility] [employer].   J.  Care Manager [facility]  

## 2017-09-06 ENCOUNTER — Telehealth: Payer: Self-pay | Admitting: Pharmacy Technician
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
## 2017-09-24 ENCOUNTER — Ambulatory Visit: Payer: Self-pay | Admitting: Pharmacist

## 2017-09-24 ENCOUNTER — Encounter: Payer: Self-pay | Admitting: Pharmacist

## 2017-09-24 VITALS — Ht 70.0 in | Wt 158.0 lb

## 2017-09-24 DIAGNOSIS — Z79899 Other long term (current) drug therapy: Secondary | ICD-10-CM
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
## 2017-09-24 NOTE — Patient Instructions (Addendum)
[facility] can provide assistance with atorvastatin, rosuvastatin or simvastatin to replace lovastatin  [facility] can assist with ranitidine (Zantac) for reflux instead of Pepcid- need a prescription to fill  [facility] can assist with fluticasone- need a prescription   [facility] will contact [facility] about setting up an appointment to establish primary care

## 2017-12-31 ENCOUNTER — Ambulatory Visit: Payer: Self-pay | Admitting: Pharmacist

## 2017-12-31 ENCOUNTER — Other Ambulatory Visit: Payer: Self-pay

## 2017-12-31 ENCOUNTER — Encounter: Payer: Self-pay | Admitting: Pharmacist

## 2017-12-31 ENCOUNTER — Encounter (INDEPENDENT_AMBULATORY_CARE_PROVIDER_SITE_OTHER): Payer: Self-pay

## 2017-12-31 VITALS — Ht 69.0 in | Wt 159.0 lb

## 2017-12-31 DIAGNOSIS — Z79899 Other long term (current) drug therapy: Secondary | ICD-10-CM

## 2017-12-31 NOTE — Patient Instructions (Signed)
Pharmacist to contact [facility] about new patient appointment (to establish primary care). They can fill medications such as Flonase nasal spray, reflux medication and lovastatin for cholesterol.  The diclofenac can be transferred to [facility] when you run out- [facility].
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
## 2018-01-06 ENCOUNTER — Encounter: Payer: Self-pay | Admitting: Pharmacist

## 2018-03-18 HISTORY — PX: CERVICAL DISC SURGERY: SHX588

## 2018-03-20 DIAGNOSIS — M5412 Radiculopathy, cervical region: Secondary | ICD-10-CM | POA: Insufficient documentation

## 2018-03-22 ENCOUNTER — Telehealth: Payer: Self-pay | Admitting: Pharmacy Technician

## 2018-03-22 NOTE — Telephone Encounter (Signed)
Patient failed to provide 2019 financial documentation.  No additional medication assistance will be provided by [facility] without the required proof of income documentation.  Patient notified by letter.  Nolyn Eilert J. Elenor Wildes Care Manager [facility] 

## 2018-03-29 ENCOUNTER — Telehealth: Payer: Self-pay | Admitting: Pharmacy Technician

## 2018-03-29 NOTE — Telephone Encounter (Signed)
Received updated proof of income.  Patient eligible to receive medication assistance at [facility] through 2019, as long as eligibility requirements continue to be met.  [facility]

## 2018-05-15 DIAGNOSIS — M1712 Unilateral primary osteoarthritis, left knee: Secondary | ICD-10-CM | POA: Insufficient documentation

## 2018-06-03 DIAGNOSIS — N529 Male erectile dysfunction, unspecified: Secondary | ICD-10-CM | POA: Insufficient documentation

## 2018-06-03 DIAGNOSIS — R399 Unspecified symptoms and signs involving the genitourinary system: Secondary | ICD-10-CM | POA: Insufficient documentation

## 2018-07-01 ENCOUNTER — Encounter: Payer: Self-pay | Admitting: Pharmacist

## 2018-07-03 DIAGNOSIS — F4310 Post-traumatic stress disorder, unspecified: Secondary | ICD-10-CM | POA: Insufficient documentation

## 2018-07-16 ENCOUNTER — Ambulatory Visit: Payer: Self-pay | Admitting: Pharmacist

## 2018-07-16 ENCOUNTER — Other Ambulatory Visit: Payer: Self-pay

## 2018-07-16 ENCOUNTER — Encounter (INDEPENDENT_AMBULATORY_CARE_PROVIDER_SITE_OTHER): Payer: Self-pay

## 2018-07-16 VITALS — BP 108/70 | Ht 70.0 in | Wt 162.0 lb

## 2018-07-16 DIAGNOSIS — Z79899 Other long term (current) drug therapy: Secondary | ICD-10-CM
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
## 2019-03-05 ENCOUNTER — Telehealth: Payer: Self-pay | Admitting: Pharmacist

## 2019-03-05 NOTE — Progress Notes (Signed)
Patient returned call and prefers to do MTM over phone at 2 pm on 3/19 on his mobile #.   Mauri Reading, PharmD Pharmacy Resident  03/05/2019 3:28 PM

## 2019-03-05 NOTE — Telephone Encounter (Signed)
Patient returned call and prefers to do MTM over phone at 2 pm on 3/19 on his mobile #.   Mauri Reading, PharmD Pharmacy Resident  03/05/2019 3:31 PM

## 2019-03-05 NOTE — Telephone Encounter (Signed)
Left message for patient to return call here at #[phone] to give the option to perform MTM over phone in light of the recent COVID situation.   Mauri Reading, PharmD Pharmacy Resident  03/05/2019 3:23 PM

## 2019-03-06 ENCOUNTER — Other Ambulatory Visit: Payer: Self-pay

## 2019-03-06 ENCOUNTER — Ambulatory Visit: Payer: Medicaid Other | Admitting: Pharmacist

## 2019-03-06 DIAGNOSIS — Z79899 Other long term (current) drug therapy: Secondary | ICD-10-CM
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
## 2019-03-10 ENCOUNTER — Other Ambulatory Visit: Payer: Self-pay

## 2019-03-10 ENCOUNTER — Encounter (INDEPENDENT_AMBULATORY_CARE_PROVIDER_SITE_OTHER): Payer: Self-pay

## 2019-09-10 ENCOUNTER — Other Ambulatory Visit: Payer: Self-pay

## 2019-09-10 ENCOUNTER — Encounter (INDEPENDENT_AMBULATORY_CARE_PROVIDER_SITE_OTHER): Payer: Self-pay

## 2019-09-10 ENCOUNTER — Telehealth: Payer: Self-pay | Admitting: Pharmacy Technician

## 2019-09-10 ENCOUNTER — Ambulatory Visit: Payer: Medicaid Other

## 2019-09-10 NOTE — Telephone Encounter (Signed)
Received 2020 proof of income.  Patient eligible to receive medication assistance at [facility] as long as eligibility requirements continue to be met.  [place] [facility]

## 2019-10-15 ENCOUNTER — Emergency Department: Payer: Medicaid Other

## 2019-10-15 ENCOUNTER — Inpatient Hospital Stay
Admission: EM | Admit: 2019-10-15 | Discharge: 2019-10-25 | DRG: 557 | Disposition: A | Discharge status: Home Health | Payer: Medicaid Other | Attending: Internal Medicine | Admitting: Internal Medicine

## 2019-10-15 DIAGNOSIS — M6282 Rhabdomyolysis: Principal | ICD-10-CM | POA: Diagnosis present

## 2019-10-15 DIAGNOSIS — M47816 Spondylosis without myelopathy or radiculopathy, lumbar region: Secondary | ICD-10-CM | POA: Diagnosis present

## 2019-10-15 DIAGNOSIS — N319 Neuromuscular dysfunction of bladder, unspecified: Secondary | ICD-10-CM | POA: Diagnosis present

## 2019-10-15 DIAGNOSIS — R0781 Pleurodynia: Secondary | ICD-10-CM

## 2019-10-15 DIAGNOSIS — G43909 Migraine, unspecified, not intractable, without status migrainosus: Secondary | ICD-10-CM | POA: Diagnosis present

## 2019-10-15 DIAGNOSIS — G459 Transient cerebral ischemic attack, unspecified: Secondary | ICD-10-CM | POA: Diagnosis present

## 2019-10-15 DIAGNOSIS — Z87820 Personal history of traumatic brain injury: Secondary | ICD-10-CM

## 2019-10-15 DIAGNOSIS — T17908A Unspecified foreign body in respiratory tract, part unspecified causing other injury, initial encounter: Secondary | ICD-10-CM

## 2019-10-15 DIAGNOSIS — F331 Major depressive disorder, recurrent, moderate: Secondary | ICD-10-CM | POA: Diagnosis present

## 2019-10-15 DIAGNOSIS — K219 Gastro-esophageal reflux disease without esophagitis: Secondary | ICD-10-CM | POA: Diagnosis present

## 2019-10-15 DIAGNOSIS — E785 Hyperlipidemia, unspecified: Secondary | ICD-10-CM | POA: Diagnosis present

## 2019-10-15 DIAGNOSIS — Z8249 Family history of ischemic heart disease and other diseases of the circulatory system: Secondary | ICD-10-CM

## 2019-10-15 DIAGNOSIS — Z981 Arthrodesis status: Secondary | ICD-10-CM

## 2019-10-15 DIAGNOSIS — Z87891 Personal history of nicotine dependence: Secondary | ICD-10-CM

## 2019-10-15 DIAGNOSIS — J690 Pneumonitis due to inhalation of food and vomit: Secondary | ICD-10-CM | POA: Diagnosis not present

## 2019-10-15 DIAGNOSIS — Z20828 Contact with and (suspected) exposure to other viral communicable diseases: Secondary | ICD-10-CM | POA: Diagnosis present

## 2019-10-15 DIAGNOSIS — R1310 Dysphagia, unspecified: Secondary | ICD-10-CM | POA: Diagnosis not present

## 2019-10-15 DIAGNOSIS — G894 Chronic pain syndrome: Secondary | ICD-10-CM | POA: Diagnosis present

## 2019-10-15 DIAGNOSIS — J9601 Acute respiratory failure with hypoxia: Secondary | ICD-10-CM

## 2019-10-15 DIAGNOSIS — Z79891 Long term (current) use of opiate analgesic: Secondary | ICD-10-CM

## 2019-10-15 DIAGNOSIS — R531 Weakness: Secondary | ICD-10-CM

## 2019-10-15 DIAGNOSIS — M4850XA Collapsed vertebra, not elsewhere classified, site unspecified, initial encounter for fracture: Secondary | ICD-10-CM | POA: Diagnosis present

## 2019-10-15 DIAGNOSIS — R339 Retention of urine, unspecified: Secondary | ICD-10-CM | POA: Diagnosis not present

## 2019-10-15 DIAGNOSIS — F4310 Post-traumatic stress disorder, unspecified: Secondary | ICD-10-CM | POA: Diagnosis present

## 2019-10-15 DIAGNOSIS — Z79899 Other long term (current) drug therapy: Secondary | ICD-10-CM

## 2019-10-15 LAB — CBC
HCT: 37.4 % — ABNORMAL LOW (ref 39.0–52.0)
Hemoglobin: 12.3 g/dL — ABNORMAL LOW (ref 13.0–17.0)
MCH: 28 pg (ref 26.0–34.0)
MCHC: 32.9 g/dL (ref 30.0–36.0)
MCV: 85.2 fL (ref 80.0–100.0)
Platelets: 407 10*3/uL — ABNORMAL HIGH (ref 150–400)
RBC: 4.39 MIL/uL (ref 4.22–5.81)
RDW: 14.1 % (ref 11.5–15.5)
WBC: 10.3 10*3/uL (ref 4.0–10.5)
nRBC: 0 % (ref 0.0–0.2)

## 2019-10-15 LAB — BASIC METABOLIC PANEL
Anion gap: 15 (ref 5–15)
BUN: 7 mg/dL (ref 6–20)
CO2: 24 mmol/L (ref 22–32)
Calcium: 9 mg/dL (ref 8.9–10.3)
Chloride: 96 mmol/L — ABNORMAL LOW (ref 98–111)
Creatinine, Ser: 0.8 mg/dL (ref 0.61–1.24)
GFR calc Af Amer: >60 mL/min (ref >60)
GFR calc non Af Amer: >60 mL/min (ref >60)
Glucose, Bld: 121 mg/dL — ABNORMAL HIGH (ref 70–99)
Potassium: 3.6 mmol/L (ref 3.5–5.1)
Sodium: 135 mmol/L (ref 135–145)

## 2019-10-15 LAB — GLUCOSE, CAPILLARY: Glucose-Capillary: 122 mg/dL — ABNORMAL HIGH (ref 70–99)

## 2019-10-15 MED ORDER — DIPHENHYDRAMINE HCL 50 MG/ML IJ SOLN
25.0000 mg | Freq: Once | INTRAMUSCULAR | Status: AC
  Administered 2019-10-15: 25 mg via INTRAVENOUS
  Filled 2019-10-15: qty 1

## 2019-10-15 MED ORDER — SODIUM CHLORIDE 0.9 % IV BOLUS
1000.0000 mL | Freq: Once | INTRAVENOUS | Status: AC
  Administered 2019-10-15: 1000 mL via INTRAVENOUS

## 2019-10-15 MED ORDER — METOPROLOL TARTRATE 25 MG PO TABS
25.0000 mg | ORAL_TABLET | Freq: Once | ORAL | Status: AC
  Administered 2019-10-15: 25 mg via ORAL
  Filled 2019-10-15: qty 1

## 2019-10-15 MED ORDER — LORAZEPAM 2 MG/ML IJ SOLN
2.0000 mg | Freq: Once | INTRAMUSCULAR | Status: AC
  Administered 2019-10-15: 2 mg via INTRAVENOUS
  Filled 2019-10-15: qty 1

## 2019-10-15 MED ORDER — HALOPERIDOL LACTATE 5 MG/ML IJ SOLN
5.0000 mg | Freq: Once | INTRAMUSCULAR | Status: AC
  Administered 2019-10-15: 5 mg via INTRAVENOUS
  Filled 2019-10-15: qty 1

## 2019-10-15 NOTE — ED Triage Notes (Signed)
Pt reports that he has been SOB X 2-3 days and having progressive weakness over last week. Pt has been experiencing weakness since January per EMS. Pt lives at home alone, VSS with EMS, CBG WNL with EMS.

## 2019-10-15 NOTE — ED Triage Notes (Signed)
FIRST NURSE NOTE- pt here for weakness since January worse over last few days. bg  153 with ems. VSS with ems

## 2019-10-15 NOTE — ED Notes (Signed)
Patient transported to CT 

## 2019-10-15 NOTE — ED Notes (Signed)
Pt repositioned on side at this time
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
## 2019-10-16 ENCOUNTER — Observation Stay: Payer: Medicaid Other

## 2019-10-16 ENCOUNTER — Observation Stay
Admit: 2019-10-16 | Discharge: 2019-10-16 | Disposition: A | Discharge status: Home / Self Care | Payer: Medicaid Other | Attending: Internal Medicine | Admitting: Internal Medicine

## 2019-10-16 DIAGNOSIS — G459 Transient cerebral ischemic attack, unspecified: Secondary | ICD-10-CM | POA: Diagnosis present

## 2019-10-16 LAB — GLUCOSE, CAPILLARY: Glucose-Capillary: 84 mg/dL (ref 70–99)

## 2019-10-16 LAB — VITAMIN B12: Vitamin B-12: 491 pg/mL (ref 180–914)

## 2019-10-16 LAB — URINALYSIS, COMPLETE (UACMP) WITH MICROSCOPIC
Bacteria, UA: NONE SEEN
Bilirubin Urine: NEGATIVE
Glucose, UA: NEGATIVE mg/dL
Hgb urine dipstick: NEGATIVE
Ketones, ur: 20 mg/dL — AB
Leukocytes,Ua: NEGATIVE
Nitrite: NEGATIVE
Protein, ur: NEGATIVE mg/dL
Specific Gravity, Urine: 1.012 (ref 1.005–1.030)
Squamous Epithelial / HPF: NONE SEEN (ref 0–5)
pH: 6 (ref 5.0–8.0)

## 2019-10-16 LAB — HEMOGLOBIN A1C
Hgb A1c MFr Bld: 5.7 % — ABNORMAL HIGH (ref 4.8–5.6)
Mean Plasma Glucose: 116.89 mg/dL

## 2019-10-16 LAB — LIPID PANEL
Cholesterol: 169 mg/dL (ref 0–200)
HDL: 36 mg/dL — ABNORMAL LOW (ref >40)
LDL Cholesterol: 113 mg/dL — ABNORMAL HIGH (ref 0–99)
Total CHOL/HDL Ratio: 4.7 RATIO
Triglycerides: 101 mg/dL (ref <150)
VLDL: 20 mg/dL (ref 0–40)

## 2019-10-16 LAB — HIV ANTIBODY (ROUTINE TESTING W REFLEX): HIV Screen 4th Generation wRfx: NONREACTIVE

## 2019-10-16 LAB — SARS CORONAVIRUS 2 (TAT 6-24 HRS): SARS Coronavirus 2: NEGATIVE

## 2019-10-16 LAB — TSH: TSH: 2.246 u[IU]/mL (ref 0.350–4.500)

## 2019-10-16 LAB — TROPONIN I (HIGH SENSITIVITY): Troponin I (High Sensitivity): 5 ng/L (ref <18)

## 2019-10-16 LAB — FOLATE: Folate: 18.5 ng/mL (ref >5.9)

## 2019-10-16 LAB — SEDIMENTATION RATE: Sed Rate: 9 mm/hr (ref 0–20)

## 2019-10-16 MED ORDER — ACETAMINOPHEN 650 MG RE SUPP
650.0000 mg | Freq: Four times a day (QID) | RECTAL | Status: DC | PRN
  Filled 2019-10-16: qty 1

## 2019-10-16 MED ORDER — CYCLOBENZAPRINE HCL 10 MG PO TABS: 10.0000 mg | ORAL_TABLET | Freq: Three times a day (TID) | ORAL | Status: DC | PRN

## 2019-10-16 MED ORDER — DIAZEPAM 5 MG PO TABS
5.0000 mg | ORAL_TABLET | Freq: Every day | ORAL | Status: DC
  Administered 2019-10-16 – 2019-10-17 (×2): 5 mg via ORAL
  Filled 2019-10-16 (×2): qty 1

## 2019-10-16 MED ORDER — GABAPENTIN 300 MG PO CAPS
600.0000 mg | ORAL_CAPSULE | Freq: Every day | ORAL | Status: DC
  Administered 2019-10-16: 600 mg via ORAL
  Filled 2019-10-16: qty 2

## 2019-10-16 MED ORDER — ONDANSETRON HCL 4 MG/2ML IJ SOLN
4.0000 mg | Freq: Four times a day (QID) | INTRAMUSCULAR | Status: DC | PRN
  Administered 2019-10-21: 08:00:00 4 mg via INTRAVENOUS
  Filled 2019-10-16: qty 2

## 2019-10-16 MED ORDER — METOPROLOL SUCCINATE ER 25 MG PO TB24
12.5000 mg | ORAL_TABLET | Freq: Every day | ORAL | Status: DC
  Administered 2019-10-16 – 2019-10-19 (×4): 12.5 mg via ORAL
  Filled 2019-10-16: qty 1
  Filled 2019-10-16: qty 0.5
  Filled 2019-10-16 (×2): qty 1

## 2019-10-16 MED ORDER — ASPIRIN EC 81 MG PO TBEC
81.0000 mg | DELAYED_RELEASE_TABLET | Freq: Every day | ORAL | Status: DC
  Administered 2019-10-16 – 2019-10-25 (×7): 81 mg via ORAL
  Filled 2019-10-16 (×8): qty 1

## 2019-10-16 MED ORDER — DICLOFENAC SODIUM 75 MG PO TBEC
75.0000 mg | DELAYED_RELEASE_TABLET | Freq: Every day | ORAL | Status: DC
  Administered 2019-10-16 – 2019-10-24 (×7): 75 mg via ORAL
  Filled 2019-10-16 (×10): qty 1

## 2019-10-16 MED ORDER — SODIUM CHLORIDE 0.9 % IV SOLN
INTRAVENOUS | Status: DC
  Administered 2019-10-17: 01:00:00 via INTRAVENOUS

## 2019-10-16 MED ORDER — FAMOTIDINE 20 MG PO TABS
20.0000 mg | ORAL_TABLET | Freq: Every day | ORAL | Status: DC
  Administered 2019-10-16 – 2019-10-24 (×8): 20 mg via ORAL
  Filled 2019-10-16 (×9): qty 1

## 2019-10-16 MED ORDER — ATORVASTATIN CALCIUM 20 MG PO TABS
40.0000 mg | ORAL_TABLET | Freq: Every evening | ORAL | Status: DC
  Filled 2019-10-16: qty 2

## 2019-10-16 MED ORDER — ENOXAPARIN SODIUM 40 MG/0.4ML ~~LOC~~ SOLN
40.0000 mg | SUBCUTANEOUS | Status: DC
  Administered 2019-10-16 – 2019-10-21 (×6): 40 mg via SUBCUTANEOUS
  Filled 2019-10-16 (×6): qty 0.4

## 2019-10-16 MED ORDER — STROKE: EARLY STAGES OF RECOVERY BOOK: Freq: Once | Status: DC

## 2019-10-16 MED ORDER — ACETAMINOPHEN 325 MG PO TABS
650.0000 mg | ORAL_TABLET | Freq: Four times a day (QID) | ORAL | Status: DC | PRN
  Administered 2019-10-24: 05:00:00 650 mg via ORAL
  Filled 2019-10-16: qty 2

## 2019-10-16 MED ORDER — ONDANSETRON HCL 4 MG PO TABS: 4.0000 mg | ORAL_TABLET | Freq: Four times a day (QID) | ORAL | Status: DC | PRN

## 2019-10-16 MED ORDER — OXYCODONE HCL 5 MG PO TABS
15.0000 mg | ORAL_TABLET | Freq: Every day | ORAL | Status: DC
  Administered 2019-10-16 – 2019-10-17 (×2): 15 mg via ORAL
  Filled 2019-10-16 (×3): qty 3

## 2019-10-16 MED ORDER — FLUOXETINE HCL 20 MG PO CAPS
40.0000 mg | ORAL_CAPSULE | Freq: Every day | ORAL | Status: DC
  Administered 2019-10-18 – 2019-10-25 (×7): 40 mg via ORAL
  Filled 2019-10-16 (×10): qty 2

## 2019-10-16 MED ORDER — MORPHINE SULFATE ER 15 MG PO TBCR
30.0000 mg | EXTENDED_RELEASE_TABLET | Freq: Two times a day (BID) | ORAL | Status: DC | PRN
  Administered 2019-10-16: 30 mg via ORAL
  Filled 2019-10-16 (×3): qty 1

## 2019-10-16 MED ORDER — VENLAFAXINE HCL ER 150 MG PO CP24: 150.0000 mg | ORAL_CAPSULE | Freq: Every evening | ORAL | Status: DC

## 2019-10-16 MED ORDER — DOCUSATE SODIUM 100 MG PO CAPS
100.0000 mg | ORAL_CAPSULE | Freq: Two times a day (BID) | ORAL | Status: DC
  Administered 2019-10-16 – 2019-10-23 (×8): 100 mg via ORAL
  Filled 2019-10-16 (×13): qty 1

## 2019-10-16 MED ORDER — ESCITALOPRAM OXALATE 10 MG PO TABS
10.0000 mg | ORAL_TABLET | Freq: Every day | ORAL | Status: DC
  Administered 2019-10-16 – 2019-10-25 (×8): 10 mg via ORAL
  Filled 2019-10-16 (×11): qty 1

## 2019-10-16 NOTE — ED Notes (Signed)
Lab at bedside

## 2019-10-16 NOTE — Progress Notes (Signed)
OT Cancellation Note  Patient Details Name: [name] MRN: [id number] DOB: [birth year]   Cancelled Treatment:    Reason Eval/Treat Not Completed: Other (comment). Consult received, chart reviewed. Patient remains in ED at this time. Will initiate OT evaluation once patient arrives to floor.  Jeni Salles, MPH, MS, OTR/L ascom [phone] 10/16/19, 2:48 PM

## 2019-10-16 NOTE — ED Notes (Signed)
Pt states needs to urinate.  Attempted multiple times but unable to void.  MD notified and order for one time straight cath.

## 2019-10-16 NOTE — ED Notes (Signed)
This RN obtained verbal consent for pt's daughters Kiley Torrence and Alfred Levins and pt's mother Cap Massi to be informed of POC/labs/tests results.

## 2019-10-16 NOTE — ED Notes (Signed)
Lab contact to collect blood from patient due to complicated draw instructions and special tubes needed.
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
## 2019-10-16 NOTE — ED Notes (Signed)
Pt resting quietly with eyes closed, no distress noted.  
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
## 2019-10-16 NOTE — ED Notes (Signed)
Pt co pain all over rates it 9/10 at this time. States unable to use a urinal due to comfort, offered condom cath and pt is willing to try it.

## 2019-10-16 NOTE — ED Notes (Signed)
Pt asleep at this time. Vital signs stable.

## 2019-10-16 NOTE — ED Notes (Addendum)
[phone], Alfred Levins called to speak with patient, pt to MRI at this time. Took daughter's phone number and will notify patient when he returns from MRI

## 2019-10-16 NOTE — ED Notes (Signed)
Report received from Ally RN. Patient care assumed. Patient/RN introduction complete. Will continue to monitor.  

## 2019-10-16 NOTE — ED Notes (Addendum)
Pt daughter given password of patient last 4 of MRN, [id number] to give to staff when calling for updates. Pt gives verbal consents to both daughters and mother to be updated.

## 2019-10-16 NOTE — ED Notes (Signed)
Pt admitted to 1C, report called to [facility]

## 2019-10-16 NOTE — Progress Notes (Signed)
PT Cancellation Note  Patient Details Name: [name] MRN: [id number] DOB: June 18, 1966   Cancelled Treatment:    Reason Eval/Treat Not Completed: (Consult received and chart reviewed. Patient remains in ED at this time.  Will initiate evaluation once patient arrives to floor.)   [name] H. Owens Shark, PT, DPT, NCS 10/16/19, 1:58 PM [phone]

## 2019-10-16 NOTE — ED Notes (Signed)
Pt back from MRI, using personal cell phone to return call to daughter Janett Billow.

## 2019-10-16 NOTE — Progress Notes (Signed)
*  PRELIMINARY RESULTS* Echocardiogram 2D Echocardiogram has been performed.  [name] 10/16/2019, 10:20 AM

## 2019-10-16 NOTE — ED Notes (Signed)
Pt resting in side lying position, NAD.

## 2019-10-16 NOTE — ED Notes (Signed)
Dietary called to send meal tray.

## 2019-10-16 NOTE — ED Notes (Addendum)
Pt daughter at bedside. Pt declines wanting to eat, states "I don't feel safe to eat after trying to take my  meds". Pt readjusted in bed and patient family updated. Will hold diet order until swallow screen evaluated.

## 2019-10-17 ENCOUNTER — Observation Stay: Payer: Medicaid Other

## 2019-10-17 ENCOUNTER — Other Ambulatory Visit: Payer: Self-pay

## 2019-10-17 LAB — ECHOCARDIOGRAM COMPLETE
Height: 70 in
Weight: 2560 oz

## 2019-10-17 LAB — IMMUNOFIXATION ELECTROPHORESIS
IgA: 194 mg/dL (ref 90–386)
IgG (Immunoglobin G), Serum: 1069 mg/dL (ref 603–1613)
IgM (Immunoglobulin M), Srm: 97 mg/dL (ref 20–172)
Total Protein ELP: 6.8 g/dL (ref 6.0–8.5)

## 2019-10-17 LAB — GLUCOSE, CAPILLARY: Glucose-Capillary: 83 mg/dL (ref 70–99)

## 2019-10-17 LAB — HEAVY METALS, BLOOD
Arsenic: 5 ug/L (ref 2–23)
Lead: 1 ug/dL (ref 0–4)
Mercury: <1 ug/L (ref 0.0–14.9)

## 2019-10-17 LAB — RPR: RPR Ser Ql: NONREACTIVE

## 2019-10-17 LAB — CK: Total CK: 938 U/L — ABNORMAL HIGH (ref 49–397)

## 2019-10-17 MED ORDER — MORPHINE SULFATE ER 15 MG PO TBCR
15.0000 mg | EXTENDED_RELEASE_TABLET | Freq: Two times a day (BID) | ORAL | Status: DC | PRN
  Administered 2019-10-21 – 2019-10-25 (×4): 15 mg via ORAL
  Filled 2019-10-17 (×4): qty 1

## 2019-10-17 MED ORDER — DIAZEPAM 2 MG PO TABS
2.0000 mg | ORAL_TABLET | Freq: Every day | ORAL | Status: DC
  Administered 2019-10-21 – 2019-10-24 (×4): 2 mg via ORAL
  Filled 2019-10-17 (×4): qty 1

## 2019-10-17 MED ORDER — SODIUM CHLORIDE 0.9 % IV BOLUS
1000.0000 mL | Freq: Once | INTRAVENOUS | Status: AC
  Administered 2019-10-17: 1000 mL via INTRAVENOUS

## 2019-10-17 MED ORDER — LACTATED RINGERS IV SOLN
INTRAVENOUS | Status: DC
  Administered 2019-10-17 – 2019-10-22 (×11): via INTRAVENOUS

## 2019-10-17 MED ORDER — OXYCODONE HCL 5 MG PO TABS
10.0000 mg | ORAL_TABLET | Freq: Every day | ORAL | Status: DC
  Administered 2019-10-17 – 2019-10-19 (×4): 10 mg via ORAL
  Filled 2019-10-17 (×4): qty 2

## 2019-10-17 NOTE — Evaluation (Signed)
Occupational Therapy Evaluation Patient Details Name: [name] MRN: [id number] DOB: 12/17/1966 Today's Date: 10/17/2019    History of Present Illness presented to ER secondary to SOB, progressive weakness and numbness in bilat hands, LEs; admitted for TIA/CVA work up. All intracranial imaging negative for acute process at this time.  C-spine and L-spine negative for acute spinal pathology.   Clinical Impression   Pt seen for OT evaluation and co-tx with PT this date. Prior to hospital admission, up until approx 1 week ago, pt reports being independent and living on his own. Currently pt demonstrates impairments in strength, coordination, and sensation bilat (LEs>UEs). Activation generally weak and inconsistent, difficulty sustaining muscle activation throughout full ROM; appears somewhat tremulous and dysmetric as result. Slight inconsistencies noted with isolated vs functional use of extremities, but globally requires increased time, effort and encouragement to complete all functional movement patterns.  Currently requiring mod/max assist for bed mobility; close sup for static sitting balance; mod/max assist +2 for sit/stand with bilat HHA from elevated bed surface.  Requires consistent facilitation at bilat knees for closed chain co-contraction and extension with lift off (R > L).  Generally 'bouncy' with effort, requiring increased time and effort for muscle activation and control with sit/stand. Pt demo's difficulty with self feeding using regular plastic utensils. With built up foam handle, pt able to grasp and eat several bites of applesauce without spillage. Pt would benefit from skilled OT to address noted impairments and functional limitations (see below for any additional details) in order to maximize safety and independence while minimizing falls risk and caregiver burden.  Upon hospital discharge, recommend pt discharge to SNF.    Follow Up Recommendations  SNF    Equipment  Recommendations  3 in 1 bedside commode    Recommendations for Other Services       Precautions / Restrictions Precautions Precautions: Fall Restrictions Weight Bearing Restrictions: No      Mobility Bed Mobility Overal bed mobility: Needs Assistance Bed Mobility: Supine to Sit     Supine to sit: Mod assist;Max assist     General bed mobility comments: assist for LE management and truncal elevation; does utilize UEs to assist with truncal elevation (though some difficulty with grasp)  Transfers Overall transfer level: Needs assistance Equipment used: 2 person hand held assist Transfers: Sit to/from Stand Sit to Stand: Mod assist;Max assist         General transfer comment: elevated bed surface and bilat UE support required; consistent facilitation at bilat knees for closed chain co-contraction and extension with lift off (R > L).  Generally 'bouncy' with effort, requiring increased time and effort for muscle activation and control with sit/stand    Balance Overall balance assessment: Needs assistance Sitting-balance support: No upper extremity supported;Feet supported Sitting balance-Leahy Scale: Fair Sitting balance - Comments: maintains with close sup, but does demonstrate mild/mod postural sway (though tends to self-correct)     Standing balance-Leahy Scale: Zero Standing balance comment: mod/max assist +2 to attain/maintain                           ADL either performed or assessed with clinical judgement   ADL Overall ADL's : Needs assistance/impaired Eating/Feeding: Set up;With adaptive utensils Eating/Feeding Details (indicate cue type and reason): performed better with built up handle on spoon, small container like applesauce to hold in L hand Grooming: Sitting;Minimal assistance   Upper Body Bathing: Sitting;Minimal assistance;Moderate assistance   Lower Body Bathing:  Sitting/lateral leans;Maximal assistance   Upper Body Dressing :  Sitting;Minimal assistance;Moderate assistance   Lower Body Dressing: Sitting/lateral leans;Maximal assistance   Toilet Transfer: +2 for physical assistance;Maximal assistance;Stand-pivot                   Vision Baseline Vision/History: No visual deficits Patient Visual Report: No change from baseline Vision Assessment?: No apparent visual deficits     Perception     Praxis      Pertinent Vitals/Pain Pain Assessment: Faces Faces Pain Scale: Hurts little more Pain Location: behind L LE Pain Descriptors / Indicators: Aching Pain Intervention(s): Limited activity within patient's tolerance;Monitored during session;Repositioned     Hand Dominance Right   Extremity/Trunk Assessment Upper Extremity Assessment Upper Extremity Assessment: Generalized weakness(grossly 3/5 proximally in isolation (4-/5 with functional activity), 2-/5 distally with noted deficit in gross grasp and fine motor coordination; reports sensory deficit mid-forearms distally. Generally dysmetric and bradykinetic for all movement)   Lower Extremity Assessment Lower Extremity Assessment: Defer to PT evaluation;Generalized weakness(grossly 3-/5 throughout, generally discoordinated and tremulous in muscle activation (give way vs inability to sustain?); endorses sensory deficit mid-thigh distally bilat; negative babinski, clonus)       Communication Communication Communication: (communicates wants and needs, appropriately answers questions and maintains topic of conversation; very minimal mouth/lip movement)   Cognition Arousal/Alertness: Awake/alert Behavior During Therapy: WFL for tasks assessed/performed Overall Cognitive Status: Within Functional Limits for tasks assessed                                     General Comments       Exercises Other Exercises Other Exercises: Sit/stand x4 with bilat HHA from raised bed surface, mod/max assist +2 for lift off, knee  facilitation/stabilization and postural extension.  Note greater functional use of L > R LE.  R lateral lean in standing due to limited R hip/knee extnesion in closed chain; mod/max assist to maintain midline. Other Exercises: Supine LE therex, 1x10, act assist ROM: ankle pumps, heel slides, hip abduct/adduct.  Activation generally weak and inconsistent, difficulty sustaining muscle activation throughout full ROM. Other Exercises: trial of AE for self feeding with improved noted with large built up foam handle for pt to scoop and bring applesauce to mouth   Shoulder Instructions      Home Living Family/patient expects to be discharged to:: Private residence Living Arrangements: Alone Available Help at Discharge: Family;Available PRN/intermittently Type of Home: House Home Access: Stairs to enter [employer] CorporationEntrance Stairs-Number of Steps: 5 front, 3 back Entrance Stairs-Rails: None Home Layout: One level     Bathroom Shower/Tub: Chief Strategy OfficerTub/shower unit   Bathroom Toilet: Standard Bathroom Accessibility: Yes   Home Equipment: Cane - single point          Prior Functioning/Environment Level of Independence: Independent with assistive device(s)        Comments: Indep with ADLs, household and community mobilization; + driving; does endorse multiple fall history (esp since January).  Does endorse significant progression in weakness, functional decline over previous 1-2 weeks (to the point of no driving, unable to walk)        OT Problem List: Decreased strength;Decreased coordination;Decreased activity tolerance;Decreased knowledge of use of DME or AE;Impaired balance (sitting and/or standing);Impaired UE functional use      OT Treatment/Interventions: Self-care/ADL training;Therapeutic exercise;Therapeutic activities;Neuromuscular education;DME and/or AE instruction;Patient/family education;Balance training    OT Goals(Current goals can be found in the care plan  section) Acute Rehab OT  Goals Patient Stated Goal: to get better, to feel like myself again OT Goal Formulation: With patient Time For Goal Achievement: 10/31/19 Potential to Achieve Goals: Good ADL Goals Pt Will Perform Eating: with modified independence;with adaptive utensils;sitting Pt Will Perform Grooming: with supervision;sitting;with adaptive equipment Pt Will Transfer to Toilet: with mod assist;with +2 assist;bedside commode  OT Frequency: Min 1X/week   Barriers to D/C: Decreased caregiver support          Co-evaluation PT/OT/SLP Co-Evaluation/Treatment: Yes Reason for Co-Treatment: Complexity of the patient's impairments (multi-system involvement);For patient/therapist safety;To address functional/ADL transfers PT goals addressed during session: Mobility/safety with mobility;Strengthening/ROM OT goals addressed during session: ADL's and self-care;Strengthening/ROM      AM-PAC OT "6 Clicks" Daily Activity     Outcome Measure Help from another person eating meals?: A Little Help from another person taking care of personal grooming?: A Little Help from another person toileting, which includes using toliet, bedpan, or urinal?: Total Help from another person bathing (including washing, rinsing, drying)?: A Lot Help from another person to put on and taking off regular upper body clothing?: A Lot Help from another person to put on and taking off regular lower body clothing?: A Lot 6 Click Score: 13   End of Session Equipment Utilized During Treatment: Gait belt  Activity Tolerance: Patient tolerated treatment well Patient left: in bed;with call bell/phone within reach;with bed alarm set  OT Visit Diagnosis: Other abnormalities of gait and mobility (R26.89);Muscle weakness (generalized) (M62.81)                Time: 5573-2202 OT Time Calculation (min): 36 min Charges:  OT General Charges $OT Visit: 1 Visit OT Evaluation $OT Eval Moderate Complexity: 1 Mod OT Treatments $Self Care/Home  Management : 8-22 mins  Jeni Salles, MPH, MS, OTR/L ascom [phone] 10/17/19, 5:13 PM

## 2019-10-17 NOTE — Progress Notes (Signed)
OT Cancellation Note  Patient Details Name: [name] MRN: [id number] DOB: 04/29/1966   Cancelled Treatment:    Reason Eval/Treat Not Completed: Fatigue/lethargy limiting ability to participate. On 3rd attempt today, pt sleeping soundly, difficult to wake despite tactile and verbal cues. Will hold today and re-attempt next date as appropriate.   Jeni Salles, MPH, MS, OTR/L ascom [phone] 10/17/19, 1:44 PM

## 2019-10-17 NOTE — Progress Notes (Signed)
PT Cancellation Note  Patient Details Name: [name] MRN: [id number] DOB: [birth year]   Cancelled Treatment:    Reason Eval/Treat Not Completed: Patient at procedure or test/unavailable(Consult received and chart reviewed.  Patient currently leaving floor for lumbar MRI.  Will re-attempt at later time/date as medically appropriate and available.)   [name], PT, DPT, NCS 10/17/19, 11:14 AM [phone]

## 2019-10-17 NOTE — Evaluation (Signed)
Physical Therapy Evaluation Patient Details Name: [name] MRN: [id number] DOB: 1966/09/19 Today's Date: 10/17/2019   History of Present Illness  presented to ER secondary to SOB, progressive weakness and numbness in bilat hands, LEs; admitted for TIA/CVA work up. All intracranial imaging negative for acute process at this time.  C-spine and L-spine negative for acute spinal pathology.  Clinical Impression  Patient sleeping upon arrival to room, but arousable to voice, light touch and washcloth to face.  Oriented to basic information and agreeable to session once waking.  Patient voices concern with inability to move mouth, though able to effectively communicate with therapist and make needs known (speaks through pursed lips).  Patient follows commands and participates session; does demonstrate global weakness, sensory and coordination deficits throughout all extremities (LEs > UEs).  Activation generally weak and inconsistent, difficulty sustaining muscle activation throughout full ROM; appears somewhat tremulous and dysmetric as result.  Slight inconsistencies noted with isolated vs functional use of extremities, but globally requires increased time, effort and encouragement to complete all functional movement patterns.  Currently requiring mod/max assist for bed mobility; close sup for static sitting balance; mod/max assist +2 for sit/stand with bilat HHA from elevated bed surface.  Requires consistent facilitation at bilat knees for closed chain co-contraction and extension with lift off (R > L).  Generally 'bouncy' with effort, requiring increased time and effort for muscle activation and control with sit/stand. Would benefit from skilled PT to address above deficits and promote optimal return to PLOF.; recommend transition to STR upon discharge from acute hospitalization.      Follow Up Recommendations SNF    Equipment Recommendations       Recommendations for Other Services        Precautions / Restrictions Precautions Precautions: Fall Restrictions Weight Bearing Restrictions: No      Mobility  Bed Mobility Overal bed mobility: Needs Assistance Bed Mobility: Supine to Sit     Supine to sit: Mod assist;Max assist     General bed mobility comments: assist for LE management and truncal elevation; does utilize UEs to assist with truncal elevation (though some difficulty with grasp)  Transfers Overall transfer level: Needs assistance Equipment used: 2 person hand held assist Transfers: Sit to/from Stand Sit to Stand: Mod assist;Max assist         General transfer comment: elevated bed surface and bilat UE support required; consistent facilitation at bilat knees for closed chain co-contraction and extension with lift off (R > L).  Generally 'bouncy' with effort, requiring increased time and effort for muscle activation and control with sit/stand  Ambulation/Gait             General Gait Details: unsafe/unable  Stairs            Wheelchair Mobility    Modified Rankin (Stroke Patients Only)       Balance Overall balance assessment: Needs assistance Sitting-balance support: No upper extremity supported;Feet supported Sitting balance-Leahy Scale: Fair Sitting balance - Comments: maintains with close sup, but does demonstrate mild/mod postural sway (though tends to self-correct)     Standing balance-Leahy Scale: Zero Standing balance comment: mod/max assist +2 to attain/maintain                             Pertinent Vitals/Pain Pain Assessment: Faces Faces Pain Scale: Hurts little more Pain Location: behind L LE Pain Descriptors / Indicators: Aching Pain Intervention(s): Limited activity within patient's tolerance;Monitored during session;Repositioned  Home Living Family/patient expects to be discharged to:: Private residence Living Arrangements: Alone Available Help at Discharge: Family;Available  PRN/intermittently Type of Home: House Home Access: Stairs to enter Entrance Stairs-Rails: None Entrance Stairs-Number of Steps: 5 front, 3 back Home Layout: One level Home Equipment: Cane - single point      Prior Function Level of Independence: Independent with assistive device(s)         Comments: Indep with ADLs, household and community mobilization; + driving; does endorse multiple fall history (esp since January).  Does endorse significant progression in weakness, functional decline over previous 1-2 weeks (to the point of no driving, unable to walk)     Hand Dominance   Dominant Hand: Right    Extremity/Trunk Assessment   Upper Extremity Assessment Upper Extremity Assessment: Generalized weakness(grossly 3/5 proximally in isolation (4-/5 with functional activity), 2-/5 distally with noted deficit in gross grasp and fine motor coordination; reports sensory deficit mid-forearms distally.  Generally dysmetric and bradykinetic for all movement)    Lower Extremity Assessment Lower Extremity Assessment: Generalized weakness(grossly 3-/5 throughout, generally discoordinated and tremulous in muscle activation (give way vs inability to sustain?); endorses sensory deficit mid-thigh distally bilat; negative babinski, clonus)       Communication   Communication: (communicates wants and needs, appropriately answers questions and maintains topic of conversation; very minimal mouth/lip movement)  Cognition Arousal/Alertness: Awake/alert Behavior During Therapy: WFL for tasks assessed/performed Overall Cognitive Status: Within Functional Limits for tasks assessed                                        General Comments      Exercises Other Exercises Other Exercises: Sit/stand x4 with bilat HHA from raised bed surface, mod/max assist +2 for lift off, knee facilitation/stabilization and postural extension.  Note greater functional use of L > R LE.  R lateral lean in  standing due to limited R hip/knee extnesion in closed chain; mod/max assist to maintain midline. Other Exercises: Supine LE therex, 1x10, act assist ROM: ankle pumps, heel slides, hip abduct/adduct.  Activation generally weak and inconsistent, difficulty sustaining muscle activation throughout full ROM.   Assessment/Plan    PT Assessment Patient needs continued PT services  PT Problem List Decreased strength;Decreased range of motion;Decreased activity tolerance;Decreased balance;Decreased mobility;Decreased coordination;Decreased knowledge of use of DME;Decreased safety awareness;Decreased knowledge of precautions;Impaired sensation       PT Treatment Interventions DME instruction;Gait training;Functional mobility training;Stair training;Therapeutic activities;Therapeutic exercise;Balance training;Neuromuscular re-education;Patient/family education    PT Goals (Current goals can be found in the Care Plan section)  Acute Rehab PT Goals Patient Stated Goal: to get better, to feel like myself again PT Goal Formulation: With patient Time For Goal Achievement: 10/31/19 Potential to Achieve Goals: Fair Additional Goals Additional Goal #1: Assess and establish gait goal for patient as appropriate.    Frequency Min 2X/week(will follow for diagnosis, and ability to participate/progress and update as appropriate)   Barriers to discharge Decreased caregiver support      Co-evaluation PT/OT/SLP Co-Evaluation/Treatment: Yes Reason for Co-Treatment: Complexity of the patient's impairments (multi-system involvement);For patient/therapist safety;To address functional/ADL transfers PT goals addressed during session: Mobility/safety with mobility;Strengthening/ROM OT goals addressed during session: ADL's and self-care;Strengthening/ROM       AM-PAC PT "6 Clicks" Mobility  Outcome Measure Help needed turning from your back to your side while in a flat bed without using bedrails?: A Lot Help  needed moving from lying on your back to sitting on the side of a flat bed without using bedrails?: A Lot Help needed moving to and from a bed to a chair (including a wheelchair)?: Total Help needed standing up from a chair using your arms (e.g., wheelchair or bedside chair)?: Total Help needed to walk in hospital room?: Total Help needed climbing 3-5 steps with a railing? : Total 6 Click Score: 8    End of Session Equipment Utilized During Treatment: Gait belt Activity Tolerance: Patient tolerated treatment well Patient left: in bed;with call bell/phone within reach;with bed alarm set Nurse Communication: Mobility status PT Visit Diagnosis: Muscle weakness (generalized) (M62.81);Difficulty in walking, not elsewhere classified (R26.2);Repeated falls (R29.6);Other symptoms and signs involving the nervous system (R29.898)    Time: [id number] PT Time Calculation (min) (ACUTE ONLY): 48 min   Charges:   PT Evaluation $PT Eval Moderate Complexity: 1 Mod PT Treatments $Therapeutic Activity: 8-22 mins       Konstantin Lehnen H. Manson PasseyBrown, PT, DPT, NCS 10/17/19, 4:21 PM [phone]

## 2019-10-17 NOTE — Progress Notes (Signed)
[facility] - [place] at [facility]   PATIENT NAME: [name]    MR#:  [id number]  DATE OF BIRTH:  [birth year]  SUBJECTIVE:  CHIEF COMPLAINT:   Chief Complaint  Patient presents with  . Weakness  . Shortness of Breath   Weakness is same. He is unable to open his mouth.  Symptoms similar to what he had in jan 2020 at [facility] that resolved with time in a month. Extensive workup and somatization thought to be the cause.  Today he is also unable to open his mouth at times. No trouble with swallowing  REVIEW OF SYSTEMS:    Review of Systems  Constitutional: Positive for malaise/fatigue. Negative for chills and fever.  HENT: Negative for sore throat.   Eyes: Negative for blurred vision, double vision and pain.  Respiratory: Negative for cough, hemoptysis, shortness of breath and wheezing.   Cardiovascular: Negative for chest pain, palpitations, orthopnea and leg swelling.  Gastrointestinal: Negative for abdominal pain, constipation, diarrhea, heartburn, nausea and vomiting.  Genitourinary: Negative for dysuria and hematuria.  Musculoskeletal: Positive for falls. Negative for back pain and joint pain.  Skin: Negative for rash.  Neurological: Positive for sensory change and focal weakness. Negative for speech change and headaches.  Endo/Heme/Allergies: Does not bruise/bleed easily.  Psychiatric/Behavioral: Negative for depression. The patient is not nervous/anxious.     DRUG ALLERGIES:   Allergies  Allergen Reactions  . Erythromycin     Childhood Unknown reaction  . Topiramate     Mood changes  . Imitrex [Sumatriptan] Nausea Only and Palpitations    VITALS:  Blood pressure 102/64, pulse 68, temperature 98.6 F (37 C), temperature source Oral, resp. rate 14, height 5' 9.5" (1.765 m), weight 64.7 kg, SpO2 96 %.  PHYSICAL EXAMINATION:   Physical Exam  GENERAL:  53 y.o.-year-old patient lying in the bed with no acute distress.  EYES: Pupils equal, round,  reactive to light and accommodation. No scleral icterus. Extraocular muscles intact.  HEENT: Head atraumatic, normocephalic. Oropharynx and nasopharynx clear.  NECK:  Supple, no jugular venous distention. No thyroid enlargement, no tenderness.  LUNGS: Normal breath sounds bilaterally, no wheezing, rales, rhonchi. No use of accessory muscles of respiration.  CARDIOVASCULAR: S1, S2 normal. No murmurs, rubs, or gallops.  ABDOMEN: Soft, nontender, nondistended. Bowel sounds present. No organomegaly or mass.  EXTREMITIES: No cyanosis, clubbing or edema b/l.    NEUROLOGIC: Cranial nerves II through XII are intact.  Motor decreased 4-/5 in lower extremities Sensory decrease below knees bilaterally PSYCHIATRIC: The patient is alert and oriented x 3.  SKIN: No obvious rash, lesion, or ulcer.   LABORATORY PANEL:   CBC Recent Labs  Lab 10/15/19 1720  WBC 10.3  HGB 12.3*  HCT 37.4*  PLT 407*   ------------------------------------------------------------------------------------------------------------------ Chemistries  Recent Labs  Lab 10/15/19 1720  NA 135  K 3.6  CL 96*  CO2 24  GLUCOSE 121*  BUN 7  CREATININE 0.80  CALCIUM 9.0   ------------------------------------------------------------------------------------------------------------------  Cardiac Enzymes No results for input(s): TROPONINI in the last 168 hours. ------------------------------------------------------------------------------------------------------------------  RADIOLOGY:  Dg Chest 2 View  Result Date: 10/15/2019 CLINICAL DATA:  Initial evaluation for acute shortness of breath. EXAM: CHEST - 2 VIEW COMPARISON:  Prior radiograph from 12/15/2015. FINDINGS: Transverse heart size at the upper limits of normal. Mediastinal silhouette within normal limits. Lungs mildly hypoinflated. Mild patchy opacity at the retrocardiac left lower lobe, which could reflect atelectasis or possibly mild infiltrate. No other focal  airspace disease. No edema  or effusion. No pneumothorax. No acute osseous finding. Prior fusion noted at the cervical spine. Sequela prior rotator cuff repair noted at the left humeral head. IMPRESSION: Mild patchy opacity at the retrocardiac left lower lobe, which could reflect atelectasis or possibly mild infiltrate. Electronically Signed   By: Rise Mu M.D.   On: 10/15/2019 19:23   Ct Head Wo Contrast  Result Date: 10/16/2019 CLINICAL DATA:  Weakness EXAM: CT HEAD WITHOUT CONTRAST TECHNIQUE: Contiguous axial images were obtained from the base of the skull through the vertex without intravenous contrast. COMPARISON:  03/01/2017 FINDINGS: Brain: No evidence of acute infarction, hemorrhage, hydrocephalus, extra-axial collection or mass lesion/mass effect. Vascular: No hyperdense vessel or unexpected calcification. Skull: Normal. Negative for fracture or focal lesion. Sinuses/Orbits: No acute finding. Other: None. IMPRESSION: Normal head CT Electronically Signed   By: Alcide Clever M.D.   On: 10/16/2019 00:06   Mr Cervical Spine Wo Contrast  Result Date: 10/16/2019 CLINICAL DATA:  Numbness of the extremities beginning 2 days ago. Difficulty walking. EXAM: MRI CERVICAL SPINE WITHOUT CONTRAST TECHNIQUE: Multiplanar, multisequence MR imaging of the cervical spine was performed. No intravenous contrast was administered. COMPARISON:  03/27/2017 FINDINGS: Alignment: Normal Vertebrae: Minimal loss of height anteriorly at T1 is unchanged. Detail at C5 and C6 is poor because of extensive artifact from an implanted device, possibly a disc arthroplasty. Cord: No cord compression or primary cord lesion. Detail limited at the C5-6 level because of the artifact. Posterior Fossa, vertebral arteries, paraspinal tissues: Negative Disc levels: No abnormality at the foramen magnum, C1-2 or C2-3. C3-4: Mild bulging of the disc. No compressive stenosis. No change. C4-5: Mild bulging of the disc. No compressive  stenosis. No change. C5-6: Limited detail because of artifact from what probably represents a disc arthroplasty. Looking at all the sequences, I doubt there is canal stenosis or any cord compromise. Foramina probably sufficiently patent. C6-7: Normal interspace. C7-T1: Normal interspace. IMPRESSION: No cause of the presenting symptoms is identified. Previous surgery at C5-6, probably a disc arthroplasty, with resultant extensive artifact that makes detail at this level poor. The axial T2 images are probably able to exclude compressive canal or foraminal stenosis. Mild, non-compressive spondylosis at C3-4 and C4-5, not visibly changed since 2018. Electronically Signed   By: Paulina Fusi M.D.   On: 10/16/2019 15:48   Mr Lumbar Spine Wo Contrast  Result Date: 10/17/2019 CLINICAL DATA:  Urinary incontinence EXAM: MRI LUMBAR SPINE WITHOUT CONTRAST TECHNIQUE: Multiplanar, multisequence MR imaging of the lumbar spine was performed. No intravenous contrast was administered. COMPARISON:  None. FINDINGS: Segmentation:  Standard. Alignment:  Physiologic. Vertebrae: Mild T12 vertebral body compression fracture without significant marrow edema consistent with a chronic compression fracture. Mild endplate sclerosis along the superior aspect of the T12 vertebral body. Remainder the vertebral body heights are maintained. No acute fracture. No discitis or osteomyelitis. No aggressive osseous lesion. Conus medullaris and cauda equina: Conus extends to the T12-L1 level. Conus and cauda equina appear normal. Paraspinal and other soft tissues: No acute paraspinal abnormality. Disc levels: Disc spaces: Degenerative disc disease mild disc height loss at L1-2. Disc spaces are otherwise maintained. T12-L1: No significant disc bulge. No evidence of neural foraminal stenosis. No central canal stenosis. L1-L2: Mild broad-based disc bulge. Mild bilateral facet arthropathy. No evidence of neural foraminal stenosis. No central canal  stenosis. L2-L3: No significant disc bulge. No evidence of neural foraminal stenosis. No central canal stenosis. Mild bilateral facet arthropathy. L3-L4: Broad-based disc bulge. Moderate bilateral facet arthropathy. No  evidence of neural foraminal stenosis. No central canal stenosis. L4-L5: Broad-based disc bulge. Moderate bilateral facet arthropathy. Mild right foraminal narrowing. No left foraminal narrowing. No central canal stenosis. L5-S1: Mild broad-based disc bulge. Mild bilateral facet arthropathy. No evidence of neural foraminal stenosis. No central canal stenosis. IMPRESSION: 1. Mild lumbar spine spondylosis as described above. 2. Chronic T12 mild anterior vertebral body compression fracture. Electronically Signed   By: Kathreen Devoid   On: 10/17/2019 11:46     ASSESSMENT AND PLAN:   * Generalized weakness Significant weakness on neuro exam but patient was able to get out bed and ambulate with a walker later. MRI cervical and lumbar spine with nothing acute B12, folate, RPR normal.  CK elevated Sent Myasthenia abx Appreciate neuro help  * Acute rhabdomyolysis Start IVF Repeat labs in AM  * Migraine Stable  DVT prophylaxis Lovenox   All the records are reviewed and case discussed with Care Management/Social Worker. Management plans discussed with the patient, family and they are in agreement.  CODE STATUS: FULL CODE  DVT Prophylaxis: SCDs  TOTAL TIME TAKING CARE OF THIS PATIENT: 40 minutes.   POSSIBLE D/C IN 1-2 DAYS, DEPENDING ON CLINICAL CONDITION.  Leia Alf Bess Saltzman M.D on 10/17/2019 at 5:05 PM  Between 7am to 6pm - Pager - [phone]  After 6pm go to [email or web address] - password [email or web address] [facility]  [facility]  Office  [phone]  CC: Primary care physician; Halstater, Pura Spice, MD  Note: This dictation was prepared with Dragon dictation along with smaller phrase technology. Any transcriptional errors that result from this process are unintentional.

## 2019-10-17 NOTE — Progress Notes (Signed)
Order changed to indwelling foley catheter from Dr. Jannifer Franklin. Patient has not voided over 14 hours, in/out cath done on prior day. 1300cc/s out.

## 2019-10-17 NOTE — Progress Notes (Signed)
OT Cancellation Note  Patient Details Name: EMILIAN STAWICKI MRN: [id number] DOB: 03-06-1966   Cancelled Treatment:    Reason Eval/Treat Not Completed: Patient at procedure or test/ unavailable. Pt out of room for imaging. Will re-attempt at later date/time as pt is available and medically appropriate for OT eval.   Jeni Salles, MPH, MS, OTR/L ascom [phone] 10/17/19, 11:17 AM

## 2019-10-17 NOTE — Progress Notes (Signed)
OT Cancellation Note  Patient Details Name: ARGIL MAHL MRN: [id number] DOB: Oct 22, 1966   Cancelled Treatment:    Reason Eval/Treat Not Completed: Patient at procedure or test/ unavailable. Consult received, chart reviewed, speech therapist with pt for assessment. Will re-attempt OT evaluation at later date/time as pt is available and medically appropriate.   Jeni Salles, MPH, MS, OTR/L ascom [phone] 10/17/19, 10:08 AM

## 2019-10-17 NOTE — Progress Notes (Addendum)
Subjective: Patient reports that today he is worse.  He reports that he can no longer move his mouth.  He reports he can drink but he can not eat.    Objective: Current vital signs: BP 109/72   Pulse 95   Temp 97.7 F (36.5 C) (Oral)   Resp 18   Ht 5' 9.5" (1.765 m)   Wt 64.7 kg   SpO2 97%   BMI 20.76 kg/m  Vital signs in last 24 hours: Temp:  [97.5 F (36.4 C)-98.7 F (37.1 C)] 97.7 F (36.5 C) (10/30 0915) Pulse Rate:  [88-101] 95 (10/30 0915) Resp:  [18-20] 18 (10/30 0915) BP: (86-118)/(53-81) 109/72 (10/30 0915) SpO2:  [95 %-100 %] 97 % (10/30 0915) Weight:  [64.7 kg] 64.7 kg (10/29 2119)  Intake/Output from previous day: 10/29 0701 - 10/30 0700 In: 434.2 [I.V.:434.2] Out: 800 [Urine:800] Intake/Output this shift: No intake/output data recorded. Nutritional status:  Diet Order            Diet Heart Room service appropriate? Yes; Fluid consistency: Thin  Diet effective now              Neurologic Exam: Mental Status: Alert.  Initially moves mouth little to speak but as he continued to speak moves his mouth more.  Resists me moving his jaw downward.   , oriented, thought content appropriate.  Speech fluent without evidence of aphasia.  Able to follow 3 step commands without difficulty. Cranial Nerves: II: Discs flat bilaterally; Visual fields grossly normal, pupils equal, round, reactive to light and accommodation III,IV, VI: ptosis not present, extra-ocular motions intact bilaterally V,VII: smile symmetric, facial light touch sensation symmetric bilaterally VIII: hearing normal bilaterally IX,X: gag reflex present XI: bilateral shoulder shrug XII: midline tongue extension Motor: Patient gives full strength in the upper extremities bilaterally with weak hand grip bilaterally.  Able to lift the BLE's at least a foot off the bed, improved from yesterday.     Sensory: Pinprick and light touch decreased in the LLE to the mid-calf and the RLE to the knee   Lab  Results: Basic Metabolic Panel: Recent Labs  Lab 10/15/19 1720  NA 135  K 3.6  CL 96*  CO2 24  GLUCOSE 121*  BUN 7  CREATININE 0.80  CALCIUM 9.0    Liver Function Tests: No results for input(s): AST, ALT, ALKPHOS, BILITOT, PROT, ALBUMIN in the last 168 hours. No results for input(s): LIPASE, AMYLASE in the last 168 hours. No results for input(s): AMMONIA in the last 168 hours.  CBC: Recent Labs  Lab 10/15/19 1720  WBC 10.3  HGB 12.3*  HCT 37.4*  MCV 85.2  PLT 407*    Cardiac Enzymes: No results for input(s): CKTOTAL, CKMB, CKMBINDEX, TROPONINI in the last 168 hours.  Lipid Panel: Recent Labs  Lab 10/16/19 0519  CHOL 169  TRIG 101  HDL 36*  CHOLHDL 4.7  VLDL 20  LDLCALC 113*    CBG: Recent Labs  Lab 10/15/19 1720 10/16/19 1623  GLUCAP 122* 11    Microbiology: Results for orders placed or performed during the hospital encounter of 10/15/19  SARS CORONAVIRUS 2 (TAT 6-24 HRS) Nasopharyngeal Nasopharyngeal Swab     Status: None   Collection Time: 10/16/19  1:34 AM   Specimen: Nasopharyngeal Swab  Result Value Ref Range Status   SARS Coronavirus 2 NEGATIVE NEGATIVE Final    Comment: (NOTE) SARS-CoV-2 target nucleic acids are NOT DETECTED. The SARS-CoV-2 RNA is generally detectable in upper and lower  respiratory specimens during the acute phase of infection. Negative results do not preclude SARS-CoV-2 infection, do not rule out co-infections with other pathogens, and should not be used as the sole basis for treatment or other patient management decisions. Negative results must be combined with clinical observations, patient history, and epidemiological information. The expected result is Negative. Fact Sheet for Patients: SugarRoll.be Fact Sheet for Healthcare Providers: https://www.woods-mathews.com/ This test is not yet approved or cleared by the [place] FDA and  has been authorized for detection  and/or diagnosis of SARS-CoV-2 by FDA under an Emergency Use Authorization (EUA). This EUA will remain  in effect (meaning this test can be used) for the duration of the COVID-19 declaration under Section 56 4(b)(1) of the Act, 21 U.S.C. section 360bbb-3(b)(1), unless the authorization is terminated or revoked sooner. Performed at [facility], [place] [address], [place], [place] [address]     Coagulation Studies: No results for input(s): LABPROT, INR in the last 72 hours.  Imaging: Dg Chest 2 View  Result Date: 10/15/2019 CLINICAL DATA:  Initial evaluation for acute shortness of breath. EXAM: CHEST - 2 VIEW COMPARISON:  Prior radiograph from 12/15/2015. FINDINGS: Transverse heart size at the upper limits of normal. Mediastinal silhouette within normal limits. Lungs mildly hypoinflated. Mild patchy opacity at the retrocardiac left lower lobe, which could reflect atelectasis or possibly mild infiltrate. No other focal airspace disease. No edema or effusion. No pneumothorax. No acute osseous finding. Prior fusion noted at the cervical spine. Sequela prior rotator cuff repair noted at the left humeral head. IMPRESSION: Mild patchy opacity at the retrocardiac left lower lobe, which could reflect atelectasis or possibly mild infiltrate. Electronically Signed   By: Jeannine Boga M.D.   On: 10/15/2019 19:23   Ct Head Wo Contrast  Result Date: 10/16/2019 CLINICAL DATA:  Weakness EXAM: CT HEAD WITHOUT CONTRAST TECHNIQUE: Contiguous axial images were obtained from the base of the skull through the vertex without intravenous contrast. COMPARISON:  03/01/2017 FINDINGS: Brain: No evidence of acute infarction, hemorrhage, hydrocephalus, extra-axial collection or mass lesion/mass effect. Vascular: No hyperdense vessel or unexpected calcification. Skull: Normal. Negative for fracture or focal lesion. Sinuses/Orbits: No acute finding. Other: None. IMPRESSION: Normal head CT Electronically Signed    By: Inez Catalina M.D.   On: 10/16/2019 00:06   Mr Cervical Spine Wo Contrast  Result Date: 10/16/2019 CLINICAL DATA:  Numbness of the extremities beginning 2 days ago. Difficulty walking. EXAM: MRI CERVICAL SPINE WITHOUT CONTRAST TECHNIQUE: Multiplanar, multisequence MR imaging of the cervical spine was performed. No intravenous contrast was administered. COMPARISON:  03/27/2017 FINDINGS: Alignment: Normal Vertebrae: Minimal loss of height anteriorly at T1 is unchanged. Detail at C5 and C6 is poor because of extensive artifact from an implanted device, possibly a disc arthroplasty. Cord: No cord compression or primary cord lesion. Detail limited at the C5-6 level because of the artifact. Posterior Fossa, vertebral arteries, paraspinal tissues: Negative Disc levels: No abnormality at the foramen magnum, C1-2 or C2-3. C3-4: Mild bulging of the disc. No compressive stenosis. No change. C4-5: Mild bulging of the disc. No compressive stenosis. No change. C5-6: Limited detail because of artifact from what probably represents a disc arthroplasty. Looking at all the sequences, I doubt there is canal stenosis or any cord compromise. Foramina probably sufficiently patent. C6-7: Normal interspace. C7-T1: Normal interspace. IMPRESSION: No cause of the presenting symptoms is identified. Previous surgery at C5-6, probably a disc arthroplasty, with resultant extensive artifact that makes detail at this level  poor. The axial T2 images are probably able to exclude compressive canal or foraminal stenosis. Mild, non-compressive spondylosis at C3-4 and C4-5, not visibly changed since 2018. Electronically Signed   By: Nelson Chimes M.D.   On: 10/16/2019 15:48    Medications:  I have reviewed the patient's current medications. Scheduled: .  stroke: mapping our early stages of recovery book   Does not apply Once  . aspirin EC  81 mg Oral Daily  . atorvastatin  40 mg Oral QPM  . diazepam  5 mg Oral Daily  . diclofenac  75 mg  Oral Daily  . docusate sodium  100 mg Oral BID  . enoxaparin (LOVENOX) injection  40 mg Subcutaneous Q24H  . escitalopram  10 mg Oral Daily  . famotidine  20 mg Oral Daily  . FLUoxetine  40 mg Oral Daily  . gabapentin  600 mg Oral QHS  . metoprolol succinate  12.5 mg Oral Daily  . oxyCODONE  15 mg Oral 5 X Daily    Assessment/Plan: 53 year old male presenting with complaints of extremity numbness and LE weakness.  Is now not urinating as well.  Work up for similar symptoms in the past have been unremarkable.  Constellation of symptoms not suggestive of a TIA or acute infarct.  Head CT reviewed and shows no acute changes. MRI of the cervical spine shows no significant cord abnormalities.   ESR, folate, B12, TSH and A1c are normal.    Recommendations: 1. Would increase Valium and give '5mg'$  in the morning and '2mg'$  at night. 2. MRI of the lumbar spine 3. Bladder scan    LOS: 0 days   Alexis Goodell, MD Neurology [phone] 10/17/2019  10:20 AM

## 2019-10-17 NOTE — Progress Notes (Signed)
Patient was up on the commode, I was right outside the door as the  patient did not want me in room, he is alert and oriented and said he would pull cord when he was done, he said he leaned forward adjusting himself on the toilette and fell forward away from toilette with his walker, said he did not hit his head, just slightly sore from walker bumping him under left arm, notified daughter and MD how is ordering x ray

## 2019-10-17 NOTE — Progress Notes (Signed)
Patient has not voided for over 14 hours, bladder scanned patient, scan showed >970ml's in bladder. Received verbal order from Dr. Jannifer Franklin for in/out cath.
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
## 2019-10-17 NOTE — Progress Notes (Signed)
MD notified of pt inability to void and bladder scan results. Pt denies any discomfort. No orders for I&O for now, instead orders for 1L NS bolus to help with low BP and to promote urine output. Bolus given and will continue to monitor.

## 2019-10-17 NOTE — Progress Notes (Signed)
SLP Cancellation Note  Patient Details Name: [name] MRN: [id number] DOB: November 27, 1966   Cancelled treatment:       Reason Eval/Treat Not Completed: SLP screened, no needs identified, will sign off  The patient does not have communication needs.  He is able to verbalize his complaints and direct others to meet his needs.  Speech is intelligible.  Will sign off on cognitive communication order.  Leroy Sea, MS/CCC- SLP  Lou Miner 10/17/2019, 3:12 PM

## 2019-10-18 DIAGNOSIS — Z87820 Personal history of traumatic brain injury: Secondary | ICD-10-CM | POA: Diagnosis not present

## 2019-10-18 DIAGNOSIS — R1310 Dysphagia, unspecified: Secondary | ICD-10-CM | POA: Diagnosis not present

## 2019-10-18 DIAGNOSIS — M6282 Rhabdomyolysis: Secondary | ICD-10-CM | POA: Diagnosis present

## 2019-10-18 DIAGNOSIS — Z981 Arthrodesis status: Secondary | ICD-10-CM | POA: Diagnosis not present

## 2019-10-18 DIAGNOSIS — R531 Weakness: Secondary | ICD-10-CM | POA: Diagnosis present

## 2019-10-18 DIAGNOSIS — F4310 Post-traumatic stress disorder, unspecified: Secondary | ICD-10-CM | POA: Diagnosis present

## 2019-10-18 DIAGNOSIS — Z20828 Contact with and (suspected) exposure to other viral communicable diseases: Secondary | ICD-10-CM | POA: Diagnosis present

## 2019-10-18 DIAGNOSIS — M4850XA Collapsed vertebra, not elsewhere classified, site unspecified, initial encounter for fracture: Secondary | ICD-10-CM | POA: Diagnosis present

## 2019-10-18 DIAGNOSIS — F331 Major depressive disorder, recurrent, moderate: Secondary | ICD-10-CM | POA: Diagnosis present

## 2019-10-18 DIAGNOSIS — K219 Gastro-esophageal reflux disease without esophagitis: Secondary | ICD-10-CM | POA: Diagnosis present

## 2019-10-18 DIAGNOSIS — G894 Chronic pain syndrome: Secondary | ICD-10-CM | POA: Diagnosis present

## 2019-10-18 DIAGNOSIS — Z8249 Family history of ischemic heart disease and other diseases of the circulatory system: Secondary | ICD-10-CM | POA: Diagnosis not present

## 2019-10-18 DIAGNOSIS — Z79899 Other long term (current) drug therapy: Secondary | ICD-10-CM | POA: Diagnosis not present

## 2019-10-18 DIAGNOSIS — Z79891 Long term (current) use of opiate analgesic: Secondary | ICD-10-CM | POA: Diagnosis not present

## 2019-10-18 DIAGNOSIS — G43909 Migraine, unspecified, not intractable, without status migrainosus: Secondary | ICD-10-CM | POA: Diagnosis present

## 2019-10-18 DIAGNOSIS — J690 Pneumonitis due to inhalation of food and vomit: Secondary | ICD-10-CM | POA: Diagnosis not present

## 2019-10-18 DIAGNOSIS — M47816 Spondylosis without myelopathy or radiculopathy, lumbar region: Secondary | ICD-10-CM | POA: Diagnosis present

## 2019-10-18 DIAGNOSIS — Z87891 Personal history of nicotine dependence: Secondary | ICD-10-CM | POA: Diagnosis not present

## 2019-10-18 DIAGNOSIS — R339 Retention of urine, unspecified: Secondary | ICD-10-CM | POA: Diagnosis not present

## 2019-10-18 DIAGNOSIS — E785 Hyperlipidemia, unspecified: Secondary | ICD-10-CM | POA: Diagnosis present

## 2019-10-18 DIAGNOSIS — N319 Neuromuscular dysfunction of bladder, unspecified: Secondary | ICD-10-CM | POA: Diagnosis present

## 2019-10-18 LAB — BASIC METABOLIC PANEL
Anion gap: 12 (ref 5–15)
BUN: 12 mg/dL (ref 6–20)
CO2: 23 mmol/L (ref 22–32)
Calcium: 8.2 mg/dL — ABNORMAL LOW (ref 8.9–10.3)
Chloride: 101 mmol/L (ref 98–111)
Creatinine, Ser: 0.82 mg/dL (ref 0.61–1.24)
GFR calc Af Amer: >60 mL/min (ref >60)
GFR calc non Af Amer: >60 mL/min (ref >60)
Glucose, Bld: 78 mg/dL (ref 70–99)
Potassium: 4.1 mmol/L (ref 3.5–5.1)
Sodium: 136 mmol/L (ref 135–145)

## 2019-10-18 LAB — CK: Total CK: 590 U/L — ABNORMAL HIGH (ref 49–397)

## 2019-10-18 MED ORDER — GABAPENTIN 300 MG PO CAPS
300.0000 mg | ORAL_CAPSULE | Freq: Three times a day (TID) | ORAL | Status: DC
  Administered 2019-10-18 – 2019-10-23 (×7): 300 mg via ORAL
  Filled 2019-10-18 (×9): qty 1

## 2019-10-18 MED ORDER — GABAPENTIN 300 MG PO CAPS: 300.0000 mg | ORAL_CAPSULE | Freq: Every day | ORAL | Status: DC

## 2019-10-18 MED ORDER — CHLORHEXIDINE GLUCONATE CLOTH 2 % EX PADS
6.0000 | MEDICATED_PAD | Freq: Every day | CUTANEOUS | Status: DC
  Administered 2019-10-18 – 2019-10-21 (×4): 6 via TOPICAL

## 2019-10-18 NOTE — Progress Notes (Signed)
Subjective: Patient was able to eat some on yesterday.  Reports some improvement in ability to move mouth today but now reports he is unable to move his legs.    Objective: Current vital signs: BP (!) 100/56 (BP Location: Left Arm)   Pulse 98   Temp 97.9 F (36.6 C)   Resp 16   Ht 5' 9.5" (1.765 m)   Wt 69.9 kg   SpO2 93%   BMI 22.43 kg/m  Vital signs in last 24 hours: Temp:  [97.7 F (36.5 C)-98.7 F (37.1 C)] 97.9 F (36.6 C) (10/31 0349) Pulse Rate:  [68-101] 98 (10/31 0349) Resp:  [14-16] 16 (10/31 0349) BP: (91-104)/(54-64) 100/56 (10/31 0349) SpO2:  [93 %-100 %] 93 % (10/31 0349) Weight:  [69.9 kg] 69.9 kg (10/31 0500)  Intake/Output from previous day: 10/30 0701 - 10/31 0700 In: 125 [I.V.:125] Out: 2300 [Urine:2300] Intake/Output this shift: Total I/O In: -  Out: 1300 [Urine:1300] Nutritional status:  Diet Order            DIET DYS 2 Room service appropriate? Yes; Fluid consistency: Thin  Diet effective now              Neurologic Exam: Mental Status: Alert, oriented, thought content appropriate.  Speech fluent without evidence of aphasia.  Able to follow 3 step commands without difficulty. Cranial Nerves: II: Visual fields grossly normal, pupils equal, round, reactive to light and accommodation III,IV, VI: ptosis not present, extra-ocular motions intact bilaterally V,VII: facial light touch sensation normal bilaterally.  Opens and closed mouth but does not smile.   VIII: hearing normal bilaterally IX,X: gag reflex present Motor: Moves both upper extremities against gravity.  At first reports that he is unable to move the lower extremities but when asked able to move toes, then flex and extend at the ankles, then flex at the knees but instead of lifting the legs off the bed moves them side to side.     Lab Results: Basic Metabolic Panel: Recent Labs  Lab 10/15/19 1720 10/18/19 0513  NA 135 136  K 3.6 4.1  CL 96* 101  CO2 24 23  GLUCOSE 121* 78   BUN 7 12  CREATININE 0.80 0.82  CALCIUM 9.0 8.2*    Liver Function Tests: No results for input(s): AST, ALT, ALKPHOS, BILITOT, PROT, ALBUMIN in the last 168 hours. No results for input(s): LIPASE, AMYLASE in the last 168 hours. No results for input(s): AMMONIA in the last 168 hours.  CBC: Recent Labs  Lab 10/15/19 1720  WBC 10.3  HGB 12.3*  HCT 37.4*  MCV 85.2  PLT 407*    Cardiac Enzymes: Recent Labs  Lab 10/17/19 1418 10/18/19 0513  CKTOTAL 938* 590*    Lipid Panel: Recent Labs  Lab 10/16/19 0519  CHOL 169  TRIG 101  HDL 36*  CHOLHDL 4.7  VLDL 20  LDLCALC 113*    CBG: Recent Labs  Lab 10/15/19 1720 10/16/19 1623 10/17/19 2044  GLUCAP 122* 84 83    Microbiology: Results for orders placed or performed during the hospital encounter of 10/15/19  SARS CORONAVIRUS 2 (TAT 6-24 HRS) Nasopharyngeal Nasopharyngeal Swab     Status: None   Collection Time: 10/16/19  1:34 AM   Specimen: Nasopharyngeal Swab  Result Value Ref Range Status   SARS Coronavirus 2 NEGATIVE NEGATIVE Final    Comment: (NOTE) SARS-CoV-2 target nucleic acids are NOT DETECTED. The SARS-CoV-2 RNA is generally detectable in upper and lower respiratory specimens during the acute  phase of infection. Negative results do not preclude SARS-CoV-2 infection, do not rule out co-infections with other pathogens, and should not be used as the sole basis for treatment or other patient management decisions. Negative results must be combined with clinical observations, patient history, and epidemiological information. The expected result is Negative. Fact Sheet for Patients: SugarRoll.be Fact Sheet for Healthcare Providers: https://www.woods-mathews.com/ This test is not yet approved or cleared by the [place] FDA and  has been authorized for detection and/or diagnosis of SARS-CoV-2 by FDA under an Emergency Use Authorization (EUA). This EUA will remain   in effect (meaning this test can be used) for the duration of the COVID-19 declaration under Section 56 4(b)(1) of the Act, 21 U.S.C. section 360bbb-3(b)(1), unless the authorization is terminated or revoked sooner. Performed at [facility], [place] [address] [place], [place] [address]     Coagulation Studies: No results for input(s): LABPROT, INR in the last 72 hours.  Imaging: Dg Ribs Unilateral Left  Result Date: 10/17/2019 CLINICAL DATA:  Left anterior rib pain after falling from the toilet this afternoon. EXAM: LEFT RIBS - 2 VIEW COMPARISON:  Chest x-ray dated 10/15/2019 FINDINGS: There is no acute fracture of the left ribs. There is an old healed fracture of the posterolateral aspect of the left tenth rib. There is minimal atelectasis at the left lung base. IMPRESSION: No acute abnormality of the left ribs. Old healed fracture of the left tenth rib. Minimal atelectasis at the left lung base. Electronically Signed   By: Lorriane Shire M.D.   On: 10/17/2019 17:26   Mr Cervical Spine Wo Contrast  Result Date: 10/16/2019 CLINICAL DATA:  Numbness of the extremities beginning 2 days ago. Difficulty walking. EXAM: MRI CERVICAL SPINE WITHOUT CONTRAST TECHNIQUE: Multiplanar, multisequence MR imaging of the cervical spine was performed. No intravenous contrast was administered. COMPARISON:  03/27/2017 FINDINGS: Alignment: Normal Vertebrae: Minimal loss of height anteriorly at T1 is unchanged. Detail at C5 and C6 is poor because of extensive artifact from an implanted device, possibly a disc arthroplasty. Cord: No cord compression or primary cord lesion. Detail limited at the C5-6 level because of the artifact. Posterior Fossa, vertebral arteries, paraspinal tissues: Negative Disc levels: No abnormality at the foramen magnum, C1-2 or C2-3. C3-4: Mild bulging of the disc. No compressive stenosis. No change. C4-5: Mild bulging of the disc. No compressive stenosis. No change. C5-6: Limited  detail because of artifact from what probably represents a disc arthroplasty. Looking at all the sequences, I doubt there is canal stenosis or any cord compromise. Foramina probably sufficiently patent. C6-7: Normal interspace. C7-T1: Normal interspace. IMPRESSION: No cause of the presenting symptoms is identified. Previous surgery at C5-6, probably a disc arthroplasty, with resultant extensive artifact that makes detail at this level poor. The axial T2 images are probably able to exclude compressive canal or foraminal stenosis. Mild, non-compressive spondylosis at C3-4 and C4-5, not visibly changed since 2018. Electronically Signed   By: Nelson Chimes M.D.   On: 10/16/2019 15:48   Mr Lumbar Spine Wo Contrast  Result Date: 10/17/2019 CLINICAL DATA:  Urinary incontinence EXAM: MRI LUMBAR SPINE WITHOUT CONTRAST TECHNIQUE: Multiplanar, multisequence MR imaging of the lumbar spine was performed. No intravenous contrast was administered. COMPARISON:  None. FINDINGS: Segmentation:  Standard. Alignment:  Physiologic. Vertebrae: Mild T12 vertebral body compression fracture without significant marrow edema consistent with a chronic compression fracture. Mild endplate sclerosis along the superior aspect of the T12 vertebral body. Remainder the vertebral body heights  are maintained. No acute fracture. No discitis or osteomyelitis. No aggressive osseous lesion. Conus medullaris and cauda equina: Conus extends to the T12-L1 level. Conus and cauda equina appear normal. Paraspinal and other soft tissues: No acute paraspinal abnormality. Disc levels: Disc spaces: Degenerative disc disease mild disc height loss at L1-2. Disc spaces are otherwise maintained. T12-L1: No significant disc bulge. No evidence of neural foraminal stenosis. No central canal stenosis. L1-L2: Mild broad-based disc bulge. Mild bilateral facet arthropathy. No evidence of neural foraminal stenosis. No central canal stenosis. L2-L3: No significant disc bulge.  No evidence of neural foraminal stenosis. No central canal stenosis. Mild bilateral facet arthropathy. L3-L4: Broad-based disc bulge. Moderate bilateral facet arthropathy. No evidence of neural foraminal stenosis. No central canal stenosis. L4-L5: Broad-based disc bulge. Moderate bilateral facet arthropathy. Mild right foraminal narrowing. No left foraminal narrowing. No central canal stenosis. L5-S1: Mild broad-based disc bulge. Mild bilateral facet arthropathy. No evidence of neural foraminal stenosis. No central canal stenosis. IMPRESSION: 1. Mild lumbar spine spondylosis as described above. 2. Chronic T12 mild anterior vertebral body compression fracture. Electronically Signed   By: Kathreen Devoid   On: 10/17/2019 11:46    Medications:  I have reviewed the patient's current medications. Scheduled: .  stroke: mapping our early stages of recovery book   Does not apply Once  . aspirin EC  81 mg Oral Daily  . atorvastatin  40 mg Oral QPM  . Chlorhexidine Gluconate Cloth  6 each Topical Daily  . diazepam  2 mg Oral QHS  . diclofenac  75 mg Oral Daily  . docusate sodium  100 mg Oral BID  . enoxaparin (LOVENOX) injection  40 mg Subcutaneous Q24H  . escitalopram  10 mg Oral Daily  . famotidine  20 mg Oral Daily  . FLUoxetine  40 mg Oral Daily  . gabapentin  600 mg Oral QHS  . metoprolol succinate  12.5 mg Oral Daily  . oxyCODONE  10 mg Oral 5 X Daily    Assessment/Plan: Patient's exam fluctuates daily.  MRI of the lumbar spine performed on yesterday shows no cord abnormalities.  RPR, folate, B12, TSH, ESR are normal.  CK improving.    Recommendations: 1. D/C Lipitor 2. Decrease Neurontin to '300mg'$  qhs    LOS: 0 days   Alexis Goodell, MD Neurology [phone] 10/18/2019  10:06 AM
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
## 2019-10-18 NOTE — Progress Notes (Signed)
[facility] [place] [facility]   PATIENT NAME: [name]    MR#:  [id number]  DATE OF BIRTH:  1966-04-07  SUBJECTIVE:  CHIEF COMPLAINT:   Chief Complaint  Patient presents with  . Weakness  . Shortness of Breath   Weakness is same. He is unable to open his mouth.  Symptoms similar to what [facility] that resolved with time in a month. Extensive workup and somatization thought to be the cause.  Inconsistent strength and ability to open mouth per nursing staff. He walks but unable to lift legs of bed. Able to speak well on the phone but mumbles with staff and says he is unable to open his mouth.   REVIEW OF SYSTEMS:    Review of Systems  Constitutional: Positive for malaise/fatigue. Negative for chills and fever.  HENT: Negative for sore throat.   Eyes: Negative for blurred vision, double vision and pain.  Respiratory: Negative for cough, hemoptysis, shortness of breath and wheezing.   Cardiovascular: Negative for chest pain, palpitations, orthopnea and leg swelling.  Gastrointestinal: Negative for abdominal pain, constipation, diarrhea, heartburn, nausea and vomiting.  Genitourinary: Negative for dysuria and hematuria.  Musculoskeletal: Positive for falls. Negative for back pain and joint pain.  Skin: Negative for rash.  Neurological: Positive for sensory change and focal weakness. Negative for speech change and headaches.  Endo/Heme/Allergies: Does not bruise/bleed easily.  Psychiatric/Behavioral: Negative for depression. The patient is not nervous/anxious.     DRUG ALLERGIES:   Allergies  Allergen Reactions  . Erythromycin     Childhood Unknown reaction  . Topiramate     Mood changes  . Imitrex [Sumatriptan] Nausea Only and Palpitations    VITALS:  Blood pressure (!) 100/56, pulse 98, temperature 97.9 F (36.6 C), resp. rate 16, height 5' 9.5" (1.765 m), weight 69.9 kg, SpO2 93 %.  PHYSICAL EXAMINATION:   Physical  Exam  GENERAL:  53 y.o.-year-old patient lying in the bed with no acute distress.  EYES: Pupils equal, round, reactive to light and accommodation. No scleral icterus. Extraocular muscles intact.  HEENT: Head atraumatic, normocephalic. Oropharynx and nasopharynx clear.  NECK:  Supple, no jugular venous distention. No thyroid enlargement, no tenderness.  LUNGS: Normal breath sounds bilaterally, no wheezing, rales, rhonchi. No use of accessory muscles of respiration.  CARDIOVASCULAR: S1, S2 normal. No murmurs, rubs, or gallops.  ABDOMEN: Soft, nontender, nondistended. Bowel sounds present. No organomegaly or mass.  EXTREMITIES: No cyanosis, clubbing or edema b/l.    NEUROLOGIC: Cranial nerves II through XII are intact.  Motor decreased 4-/5 in lower extremities Sensory decrease below knees bilaterally PSYCHIATRIC: The patient is alert and oriented x 3.  SKIN: No obvious rash, lesion, or ulcer.   LABORATORY PANEL:   CBC Recent Labs  Lab 10/15/19 1720  WBC 10.3  HGB 12.3*  HCT 37.4*  PLT 407*   ------------------------------------------------------------------------------------------------------------------ Chemistries  Recent Labs  Lab 10/18/19 0513  NA 136  K 4.1  CL 101  CO2 23  GLUCOSE 78  BUN 12  CREATININE 0.82  CALCIUM 8.2*   ------------------------------------------------------------------------------------------------------------------  Cardiac Enzymes No results for input(s): TROPONINI in the last 168 hours. ------------------------------------------------------------------------------------------------------------------  RADIOLOGY:  Dg Ribs Unilateral Left  Result Date: 10/17/2019 CLINICAL DATA:  Left anterior rib pain after falling from the toilet this afternoon. EXAM: LEFT RIBS - 2 VIEW COMPARISON:  Chest x-ray dated 10/15/2019 FINDINGS: There is no acute fracture of the left ribs. There is an old healed  fracture of the posterolateral aspect of the left  tenth rib. There is minimal atelectasis at the left lung base. IMPRESSION: No acute abnormality of the left ribs. Old healed fracture of the left tenth rib. Minimal atelectasis at the left lung base. Electronically Signed   By: Francene Boyers M.D.   On: 10/17/2019 17:26   Mr Cervical Spine Wo Contrast  Result Date: 10/16/2019 CLINICAL DATA:  Numbness of the extremities beginning 2 days ago. Difficulty walking. EXAM: MRI CERVICAL SPINE WITHOUT CONTRAST TECHNIQUE: Multiplanar, multisequence MR imaging of the cervical spine was performed. No intravenous contrast was administered. COMPARISON:  03/27/2017 FINDINGS: Alignment: Normal Vertebrae: Minimal loss of height anteriorly at T1 is unchanged. Detail at C5 and C6 is poor because of extensive artifact from an implanted device, possibly a disc arthroplasty. Cord: No cord compression or primary cord lesion. Detail limited at the C5-6 level because of the artifact. Posterior Fossa, vertebral arteries, paraspinal tissues: Negative Disc levels: No abnormality at the foramen magnum, C1-2 or C2-3. C3-4: Mild bulging of the disc. No compressive stenosis. No change. C4-5: Mild bulging of the disc. No compressive stenosis. No change. C5-6: Limited detail because of artifact from what probably represents a disc arthroplasty. Looking at all the sequences, I doubt there is canal stenosis or any cord compromise. Foramina probably sufficiently patent. C6-7: Normal interspace. C7-T1: Normal interspace. IMPRESSION: No cause of the presenting symptoms is identified. Previous surgery at C5-6, probably a disc arthroplasty, with resultant extensive artifact that makes detail at this level poor. The axial T2 images are probably able to exclude compressive canal or foraminal stenosis. Mild, non-compressive spondylosis at C3-4 and C4-5, not visibly changed since 2018. Electronically Signed   By: Paulina Fusi M.D.   On: 10/16/2019 15:48   Mr Lumbar Spine Wo Contrast  Result Date:  10/17/2019 CLINICAL DATA:  Urinary incontinence EXAM: MRI LUMBAR SPINE WITHOUT CONTRAST TECHNIQUE: Multiplanar, multisequence MR imaging of the lumbar spine was performed. No intravenous contrast was administered. COMPARISON:  None. FINDINGS: Segmentation:  Standard. Alignment:  Physiologic. Vertebrae: Mild T12 vertebral body compression fracture without significant marrow edema consistent with a chronic compression fracture. Mild endplate sclerosis along the superior aspect of the T12 vertebral body. Remainder the vertebral body heights are maintained. No acute fracture. No discitis or osteomyelitis. No aggressive osseous lesion. Conus medullaris and cauda equina: Conus extends to the T12-L1 level. Conus and cauda equina appear normal. Paraspinal and other soft tissues: No acute paraspinal abnormality. Disc levels: Disc spaces: Degenerative disc disease mild disc height loss at L1-2. Disc spaces are otherwise maintained. T12-L1: No significant disc bulge. No evidence of neural foraminal stenosis. No central canal stenosis. L1-L2: Mild broad-based disc bulge. Mild bilateral facet arthropathy. No evidence of neural foraminal stenosis. No central canal stenosis. L2-L3: No significant disc bulge. No evidence of neural foraminal stenosis. No central canal stenosis. Mild bilateral facet arthropathy. L3-L4: Broad-based disc bulge. Moderate bilateral facet arthropathy. No evidence of neural foraminal stenosis. No central canal stenosis. L4-L5: Broad-based disc bulge. Moderate bilateral facet arthropathy. Mild right foraminal narrowing. No left foraminal narrowing. No central canal stenosis. L5-S1: Mild broad-based disc bulge. Mild bilateral facet arthropathy. No evidence of neural foraminal stenosis. No central canal stenosis. IMPRESSION: 1. Mild lumbar spine spondylosis as described above. 2. Chronic T12 mild anterior vertebral body compression fracture. Electronically Signed   By: Elige Ko   On: 10/17/2019 11:46      ASSESSMENT AND PLAN:   * Generalized weakness Significant weakness on neuro  exam but patient was able to get out bed and ambulate with a walker later. MRI cervical and lumbar spine with nothing acute B12, folate, RPR normal.  CK elevated Sent Myasthenia abx Appreciate neuro help  * Acute rhabdomyolysis - Improving. No AKI Ck 900. Likely higher on admission as this was checked on Day 3 Started IVF Repeat labs in AM  * Migraine Stable  DVT prophylaxis Lovenox   Will consult psychiatry as no organic cause found for his symptoms and I doubt the mild rhabdomyolysis is causing his issues.  D/c to SNF   All the records are reviewed and case discussed with Care Management/Social Worker. Management plans discussed with the patient, family and they are in agreement.  CODE STATUS: FULL CODE  TOTAL TIME TAKING CARE OF THIS PATIENT: 40 minutes.   POSSIBLE D/C IN 1-2 DAYS, DEPENDING ON CLINICAL CONDITION.  Leia Alf Jasmynn Pfalzgraf M.D on 10/18/2019 at 10:14 AM  Between 7am to 6pm - Pager - [phone]  After 6pm go to [email or web address] - password [email or web address] [facility]  [facility]  Office  [phone]  CC: Primary care physician; Halstater, Pura Spice, MD  Note: This dictation was prepared with Dragon dictation along with smaller phrase technology. Any transcriptional errors that result from this process are unintentional.

## 2019-10-18 NOTE — Progress Notes (Signed)
Patient's routine pain medications held due to lethargy. Patient denied pain or discomfort. No injuries noted resulting from fall. Able to sleep through the night. Will endorse.

## 2019-10-18 NOTE — Progress Notes (Addendum)
Patient continues to report he is unable to open his mouth or move; however, upon neuro assessment this RN noted patient is able to lift arms bilaterally, has moderate plantar flexion strength, weak but equal grip strength, and is able to open and close mouth with instruction.  MD aware.

## 2019-10-19 DIAGNOSIS — R1310 Dysphagia, unspecified: Secondary | ICD-10-CM

## 2019-10-19 DIAGNOSIS — R531 Weakness: Secondary | ICD-10-CM

## 2019-10-19 LAB — CK: Total CK: 650 U/L — ABNORMAL HIGH (ref 49–397)

## 2019-10-19 LAB — GLUCOSE, CAPILLARY: Glucose-Capillary: 119 mg/dL — ABNORMAL HIGH (ref 70–99)

## 2019-10-19 MED ORDER — OXYCODONE HCL 5 MG/5ML PO SOLN
10.0000 mg | Freq: Every day | ORAL | Status: DC
  Administered 2019-10-19 – 2019-10-20 (×2): 10 mg via ORAL
  Filled 2019-10-19 (×3): qty 10
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
## 2019-10-19 NOTE — Plan of Care (Signed)
  Problem: Education: Goal: Knowledge of disease or condition will improve Outcome: Progressing Goal: Knowledge of secondary prevention will improve Outcome: Progressing Goal: Knowledge of patient specific risk factors addressed and post discharge goals established will improve Outcome: Progressing Goal: Individualized Educational Video(s) Outcome: Progressing   Problem: Coping: Goal: Will verbalize positive feelings about self Outcome: Progressing Goal: Will identify appropriate support needs Outcome: Progressing   Problem: Health Behavior/Discharge Planning: Goal: Ability to manage health-related needs will improve Outcome: Progressing   Problem: Self-Care: Goal: Ability to communicate needs accurately will improve Outcome: Progressing   Problem: Nutrition: Goal: Risk of aspiration will decrease Outcome: Progressing Goal: Dietary intake will improve Outcome: Progressing   Problem: Intracerebral Hemorrhage Tissue Perfusion: Goal: Complications of Intracerebral Hemorrhage will be minimized Outcome: Progressing   Problem: Ischemic Stroke/TIA Tissue Perfusion: Goal: Complications of ischemic stroke/TIA will be minimized Outcome: Progressing   Problem: Spontaneous Subarachnoid Hemorrhage Tissue Perfusion: Goal: Complications of Spontaneous Subarachnoid Hemorrhage will be minimized Outcome: Progressing   Problem: Education: Goal: Knowledge of General Education information will improve Description: Including pain rating scale, medication(s)/side effects and non-pharmacologic comfort measures Outcome: Progressing   Problem: Health Behavior/Discharge Planning: Goal: Ability to manage health-related needs will improve Outcome: Progressing   Problem: Clinical Measurements: Goal: Ability to maintain clinical measurements within normal limits will improve Outcome: Progressing Goal: Will remain free from infection Outcome: Progressing Goal: Diagnostic test results will  improve Outcome: Progressing Goal: Respiratory complications will improve Outcome: Progressing Goal: Cardiovascular complication will be avoided Outcome: Progressing   Problem: Activity: Goal: Risk for activity intolerance will decrease Outcome: Progressing   Problem: Nutrition: Goal: Adequate nutrition will be maintained Outcome: Progressing   Problem: Coping: Goal: Level of anxiety will decrease Outcome: Progressing   Problem: Elimination: Goal: Will not experience complications related to bowel motility Outcome: Progressing Goal: Will not experience complications related to urinary retention Outcome: Progressing   Problem: Pain Managment: Goal: General experience of comfort will improve Outcome: Progressing   Problem: Safety: Goal: Ability to remain free from injury will improve Outcome: Progressing   Problem: Skin Integrity: Goal: Risk for impaired skin integrity will decrease Outcome: Progressing   
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
## 2019-10-19 NOTE — Progress Notes (Signed)
Daughter Arby Barrette called to request an extensive MRI of the brain stem for [employer].. MD made aware.

## 2019-10-20 ENCOUNTER — Inpatient Hospital Stay: Payer: Medicaid Other

## 2019-10-20 DIAGNOSIS — J690 Pneumonitis due to inhalation of food and vomit: Secondary | ICD-10-CM

## 2019-10-20 LAB — CRYOGLOBULIN

## 2019-10-20 LAB — BASIC METABOLIC PANEL
Anion gap: 11 (ref 5–15)
BUN: 13 mg/dL (ref 6–20)
CO2: 24 mmol/L (ref 22–32)
Calcium: 8.3 mg/dL — ABNORMAL LOW (ref 8.9–10.3)
Chloride: 101 mmol/L (ref 98–111)
Creatinine, Ser: 0.6 mg/dL — ABNORMAL LOW (ref 0.61–1.24)
GFR calc Af Amer: >60 mL/min (ref >60)
GFR calc non Af Amer: >60 mL/min (ref >60)
Glucose, Bld: 108 mg/dL — ABNORMAL HIGH (ref 70–99)
Potassium: 4.3 mmol/L (ref 3.5–5.1)
Sodium: 136 mmol/L (ref 135–145)

## 2019-10-20 LAB — CK: Total CK: 340 U/L (ref 49–397)

## 2019-10-20 MED ORDER — GADOBUTROL 1 MMOL/ML IV SOLN
7.0000 mL | Freq: Once | INTRAVENOUS | Status: AC | PRN
  Administered 2019-10-20: 14:00:00 7 mL via INTRAVENOUS

## 2019-10-20 MED ORDER — METOPROLOL TARTRATE 25 MG PO TABS
12.5000 mg | ORAL_TABLET | Freq: Two times a day (BID) | ORAL | Status: DC
  Administered 2019-10-20: 13:00:00 12.5 mg via ORAL
  Filled 2019-10-20: qty 1

## 2019-10-20 MED ORDER — SODIUM CHLORIDE 0.9 % IV SOLN
3.0000 g | Freq: Four times a day (QID) | INTRAVENOUS | Status: DC
  Administered 2019-10-20 – 2019-10-21 (×4): 3 g via INTRAVENOUS
  Filled 2019-10-20 (×3): qty 8
  Filled 2019-10-20: qty 3
  Filled 2019-10-20: qty 8
  Filled 2019-10-20: qty 3
  Filled 2019-10-20 (×2): qty 8

## 2019-10-20 NOTE — TOC Progression Note (Addendum)
Transition of Care [facility]) - Progression Note    Patient Details  Name: [name] MRN: [id number] Date of Birth: 1965/12/20  Transition of Care [facility]) CM/SW Contact  Kynisha Memon, Lenice Llamas Phone Number: [phone]  10/20/2019, 9:22 AM  Clinical Narrative: Clinical Social Worker (CSW) emailed [facility] supervisor to inquire about SNF placement. Patient has no insurance and no payer for SNF.   3:30 pm: Per [facility] assistant director patient will not be eligible for a SNF LOG at this time. CSW met with patient to discuss private pay for SNF. Patient reported that he can't afford to private pay for SNF. Patient reported that he may have someone that can check on him at home.     Expected Discharge Plan: Skilled Nursing Facility(home with home health vs SNF) Barriers to Discharge: Inadequate or no insurance  Expected Discharge Plan and Services Expected Discharge Plan: Skilled Nursing Facility(home with home health vs SNF)   Discharge Planning Services: CM Consult Post Acute Care Choice: NA Living arrangements for the past 2 months: Single Family Home                 DME Arranged: (NA) DME Agency: (NA) Date DME Agency Contacted: (NA)   Representative spoke with at DME Agency: NA [place]: NA           Social Determinants of Health ([place]) Interventions    Readmission Risk Interventions Readmission Risk Prevention Plan 10/19/2019 10/19/2019  Post Dischage Appt Not Complete Not Complete  Appt Comments No DC order yet Patient not dischargted yet  Medication Screening (No Data) -  Transportation Screening Complete Complete  Some recent data might be hidden
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
## 2019-10-20 NOTE — Progress Notes (Signed)
Pharmacy Antibiotic Note  [name] is a 53 y.o. male admitted on 10/15/2019. Pharmacy has been consulted for Unasyn dosing for aspiration pneumonia.  Plan: Unasyn 3 g IV q6h  Height: 5' 9.5" (176.5 cm) Weight: 158 lb 15.2 oz (72.1 kg) IBW/kg (Calculated) : 71.85  Temp (24hrs), Avg:98.6 F (37 C), Min:98 F (36.7 C), Max:99.1 F (37.3 C)  Recent Labs  Lab 10/15/19 1720 10/18/19 0513 10/20/19 0420  WBC 10.3  --   --   CREATININE 0.80 0.82 0.60*    Estimated Creatinine Clearance: 108.6 mL/min (A) (by C-G formula based on SCr of 0.6 mg/dL (L)).    Allergies  Allergen Reactions  . Erythromycin     Childhood Unknown reaction  . Topiramate     Mood changes  . Imitrex [Sumatriptan] Nausea Only and Palpitations    Antimicrobials this admission: Unasyn 11/2 >>  Microbiology results: 10/29 SARS Coronavirus 2: negative  Thank you for allowing pharmacy to be a part of this patient's care.  Tawnya Crook, PharmD 10/20/2019 10:44 AM

## 2019-10-20 NOTE — Progress Notes (Signed)
Patient's oxygen at 80% on RA, applied 2L via [place], 02 sats at 93%. Notified Merlene Laughter NP, asked for a chest x ray, she does not want one at this time. Will continue to monitor the patient.

## 2019-10-20 NOTE — Progress Notes (Signed)
Patient is coughing after drinking. Remained on 2L 02, sats at 92%. Patient seems to be aspirating after drinking. Explained to the patient and informed to not drink until evaluated. Will relay to day shift RN.

## 2019-10-20 NOTE — Plan of Care (Signed)
  Problem: Education: Goal: Knowledge of disease or condition will improve Outcome: Progressing Goal: Knowledge of secondary prevention will improve Outcome: Progressing Goal: Knowledge of patient specific risk factors addressed and post discharge goals established will improve Outcome: Progressing Goal: Individualized Educational Video(s) Outcome: Progressing   Problem: Coping: Goal: Will verbalize positive feelings about self Outcome: Progressing Goal: Will identify appropriate support needs Outcome: Progressing   Problem: Health Behavior/Discharge Planning: Goal: Ability to manage health-related needs will improve Outcome: Progressing   Problem: Self-Care: Goal: Ability to communicate needs accurately will improve Outcome: Progressing   Problem: Nutrition: Goal: Risk of aspiration will decrease Outcome: Progressing Goal: Dietary intake will improve Outcome: Progressing   Problem: Intracerebral Hemorrhage Tissue Perfusion: Goal: Complications of Intracerebral Hemorrhage will be minimized Outcome: Progressing   Problem: Ischemic Stroke/TIA Tissue Perfusion: Goal: Complications of ischemic stroke/TIA will be minimized Outcome: Progressing   Problem: Spontaneous Subarachnoid Hemorrhage Tissue Perfusion: Goal: Complications of Spontaneous Subarachnoid Hemorrhage will be minimized Outcome: Progressing   Problem: Education: Goal: Knowledge of General Education information will improve Description: Including pain rating scale, medication(s)/side effects and non-pharmacologic comfort measures Outcome: Progressing   Problem: Health Behavior/Discharge Planning: Goal: Ability to manage health-related needs will improve Outcome: Progressing   Problem: Clinical Measurements: Goal: Ability to maintain clinical measurements within normal limits will improve Outcome: Progressing Goal: Will remain free from infection Outcome: Progressing Goal: Diagnostic test results will  improve Outcome: Progressing Goal: Respiratory complications will improve Outcome: Progressing Goal: Cardiovascular complication will be avoided Outcome: Progressing   Problem: Activity: Goal: Risk for activity intolerance will decrease Outcome: Progressing   Problem: Nutrition: Goal: Adequate nutrition will be maintained Outcome: Progressing   Problem: Coping: Goal: Level of anxiety will decrease Outcome: Progressing   Problem: Elimination: Goal: Will not experience complications related to bowel motility Outcome: Progressing Goal: Will not experience complications related to urinary retention Outcome: Progressing   Problem: Pain Managment: Goal: General experience of comfort will improve Outcome: Progressing   Problem: Safety: Goal: Ability to remain free from injury will improve Outcome: Progressing   Problem: Skin Integrity: Goal: Risk for impaired skin integrity will decrease Outcome: Progressing   

## 2019-10-20 NOTE — Progress Notes (Signed)
OT Cancellation Note  Patient Details Name: [name] MRN: [id number] DOB: [birth year]   Cancelled Treatment:    Reason Eval/Treat Not Completed: Patient at procedure or test/ unavailable. Upon attempt for OT tx this am, pt out of room for procedure. Per chart, pt pending swallow study. Will re-attempt OT tx at later date/time as pt is available and medically appropriate.   Jeni Salles, MPH, MS, OTR/L ascom [phone] 10/20/19, 11:36 AM

## 2019-10-20 NOTE — Progress Notes (Signed)
Received MD order to discharge patient to home, reviewed home meds discharge instructions and  follow up appointments with patient and she verbalized understanding
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
## 2019-10-20 NOTE — Plan of Care (Signed)
  Problem: Education: Goal: Knowledge of disease or condition will improve Outcome: Progressing Goal: Knowledge of secondary prevention will improve Outcome: Progressing Goal: Knowledge of patient specific risk factors addressed and post discharge goals established will improve Outcome: Progressing Goal: Individualized Educational Video(s) Outcome: Progressing   Problem: Coping: Goal: Will verbalize positive feelings about self Outcome: Progressing Goal: Will identify appropriate support needs Outcome: Progressing   Problem: Health Behavior/Discharge Planning: Goal: Ability to manage health-related needs will improve Outcome: Progressing   Problem: Self-Care: Goal: Ability to communicate needs accurately will improve Outcome: Progressing   Problem: Nutrition: Goal: Risk of aspiration will decrease Outcome: Progressing Goal: Dietary intake will improve Outcome: Progressing   Problem: Intracerebral Hemorrhage Tissue Perfusion: Goal: Complications of Intracerebral Hemorrhage will be minimized Outcome: Progressing   Problem: Ischemic Stroke/TIA Tissue Perfusion: Goal: Complications of ischemic stroke/TIA will be minimized Outcome: Progressing   Problem: Spontaneous Subarachnoid Hemorrhage Tissue Perfusion: Goal: Complications of Spontaneous Subarachnoid Hemorrhage will be minimized Outcome: Progressing   Problem: Education: Goal: Knowledge of General Education information will improve Description: Including pain rating scale, medication(s)/side effects and non-pharmacologic comfort measures Outcome: Progressing   Problem: Health Behavior/Discharge Planning: Goal: Ability to manage health-related needs will improve Outcome: Progressing   Problem: Clinical Measurements: Goal: Ability to maintain clinical measurements within normal limits will improve Outcome: Progressing Goal: Will remain free from infection Outcome: Progressing Goal: Diagnostic test results will  improve Outcome: Progressing Goal: Respiratory complications will improve Outcome: Progressing Goal: Cardiovascular complication will be avoided Outcome: Progressing   Problem: Activity: Goal: Risk for activity intolerance will decrease Outcome: Progressing   Problem: Nutrition: Goal: Adequate nutrition will be maintained Outcome: Progressing   Problem: Coping: Goal: Level of anxiety will decrease Outcome: Progressing   Problem: Elimination: Goal: Will not experience complications related to bowel motility Outcome: Progressing Goal: Will not experience complications related to urinary retention Outcome: Progressing   Problem: Pain Managment: Goal: General experience of comfort will improve Outcome: Progressing   Problem: Safety: Goal: Ability to remain free from injury will improve Outcome: Progressing   Problem: Skin Integrity: Goal: Risk for impaired skin integrity will decrease Outcome: Progressing

## 2019-10-21 LAB — BASIC METABOLIC PANEL
Anion gap: 15 (ref 5–15)
BUN: 13 mg/dL (ref 6–20)
CO2: 25 mmol/L (ref 22–32)
Calcium: 8.5 mg/dL — ABNORMAL LOW (ref 8.9–10.3)
Chloride: 97 mmol/L — ABNORMAL LOW (ref 98–111)
Creatinine, Ser: 0.63 mg/dL (ref 0.61–1.24)
GFR calc Af Amer: >60 mL/min (ref >60)
GFR calc non Af Amer: >60 mL/min (ref >60)
Glucose, Bld: 94 mg/dL (ref 70–99)
Potassium: 3.9 mmol/L (ref 3.5–5.1)
Sodium: 137 mmol/L (ref 135–145)

## 2019-10-21 LAB — VITAMIN B1: Vitamin B1 (Thiamine): 70.6 nmol/L (ref 66.5–200.0)

## 2019-10-21 MED ORDER — OXYCODONE HCL 5 MG PO TABS
10.0000 mg | ORAL_TABLET | Freq: Four times a day (QID) | ORAL | Status: DC
  Administered 2019-10-21 – 2019-10-25 (×13): 10 mg via ORAL
  Filled 2019-10-21 (×13): qty 2

## 2019-10-21 MED ORDER — AMOXICILLIN-POT CLAVULANATE 875-125 MG PO TABS
1.0000 | ORAL_TABLET | Freq: Two times a day (BID) | ORAL | Status: DC
  Administered 2019-10-21 – 2019-10-25 (×9): 1 via ORAL
  Filled 2019-10-21 (×8): qty 1

## 2019-10-21 MED ORDER — ENOXAPARIN SODIUM 40 MG/0.4ML ~~LOC~~ SOLN
40.0000 mg | SUBCUTANEOUS | Status: DC
  Administered 2019-10-22 – 2019-10-25 (×4): 40 mg via SUBCUTANEOUS
  Filled 2019-10-21 (×4): qty 0.4

## 2019-10-21 MED ORDER — BENZOCAINE 10 % MT GEL
Freq: Four times a day (QID) | OROMUCOSAL | Status: DC | PRN
  Administered 2019-10-22: via OROMUCOSAL
  Filled 2019-10-21: qty 9

## 2019-10-21 MED ORDER — OXYCODONE HCL 5 MG/5ML PO SOLN
10.0000 mg | Freq: Four times a day (QID) | ORAL | Status: DC
  Filled 2019-10-21: qty 10

## 2019-10-21 MED ORDER — METOPROLOL TARTRATE 5 MG/5ML IV SOLN
5.0000 mg | Freq: Four times a day (QID) | INTRAVENOUS | Status: DC
  Administered 2019-10-21 – 2019-10-22 (×4): 5 mg via INTRAVENOUS
  Filled 2019-10-21 (×5): qty 5

## 2019-10-21 NOTE — Progress Notes (Signed)
Patients heart rate was 125 during the night and now it is 119. Patient states that he can feel it and it is making his nauseous. He will be getting Metoprolol 12.5 in morning medications. Messaged Dr. Sarajane Jews, new order for EKG and tele monitoring.
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
## 2019-10-21 NOTE — Progress Notes (Addendum)
Occupational Therapy Treatment Patient Details Name: [name] MRN: [id number] DOB: 1966/04/22 Today's Date: 10/21/2019    History of present illness Pt. is a 53 y.o. male who presented to ER secondary to SOB, progressive weakness and numbness in bilat hands, LEs; admitted for TIA/CVA work up. All intracranial imaging negative for acute process at this time. C-spine and L-spine negative for acute spinal pathology.   OT comments  Upon arrival pt. was using his smart phone, and texting which pt. reports takes him a very long time to type one word. Pt. worked on engaging his BUEs during self-grooming tasks. Pt. was able to open containers, and bottles with increased time. Pt. required hand-over hand assist to comb his hair with his right hand. Pt. education was provided about opportunities to engage his UEs during daily tasks. Pt. reports the adaptive foam handle is helpful, and makes it easier for him to feed himself. HR 97 bpms. Pt. Performed AROM in the BUEs. Pt. continues to benefit from OT services for ADL training, A/E training, UE there. Ex., neuro muscular re-education, and pt. education about home modification, and DME. Pt. continues to be appropriate for SNF level of care at this time.    Follow Up Recommendations  SNF    Equipment Recommendations  3 in 1 bedside commode    Recommendations for Other Services      Precautions / Restrictions Precautions Precautions: Fall Restrictions Weight Bearing Restrictions: No       Mobility Bed Mobility    Deferred              Transfers    Deferred                  Balance                                           ADL either performed or assessed with clinical judgement   ADL Overall ADL's : Needs assistance/impaired Eating/Feeding: Set up;With adaptive utensils;Bed level   Grooming: Minimal assistance;Bed level   Upper Body Bathing: Minimal assistance;Moderate assistance;Bed level    Lower Body Bathing: Sitting/lateral leans;Maximal assistance;Bed level   Upper Body Dressing : Sitting;Minimal assistance;Moderate assistance   Lower Body Dressing: Sitting/lateral leans;Maximal assistance               Functional mobility during ADLs: Supervision/safety       Vision Baseline Vision/History: No visual deficits Patient Visual Report: No change from baseline     Perception     Praxis      Cognition Arousal/Alertness: Awake/alert Behavior During Therapy: WFL for tasks assessed/performed Overall Cognitive Status: Within Functional Limits for tasks assessed                                          Exercises     Shoulder Instructions       General Comments      Pertinent Vitals/ Pain       Pain Assessment: 0-10 Pain Score: 5  Pain Location: LEs Pain Descriptors / Indicators: Aching Pain Intervention(s): Limited activity within patient's tolerance  Home Living  Prior Functioning/Environment              Frequency  Min 1X/week        Progress Toward Goals  OT Goals(current goals can now be found in the care plan section)  Progress towards OT goals: OT to reassess next treatment  Acute Rehab OT Goals Patient Stated Goal: To get better OT Goal Formulation: With patient Time For Goal Achievement: 10/31/19 Potential to Achieve Goals: Good  Plan      Co-evaluation                 AM-PAC OT "6 Clicks" Daily Activity     Outcome Measure   Help from another person eating meals?: A Little Help from another person taking care of personal grooming?: A Little Help from another person toileting, which includes using toliet, bedpan, or urinal?: Total Help from another person bathing (including washing, rinsing, drying)?: A Lot Help from another person to put on and taking off regular upper body clothing?: A Lot Help from another person to put on and  taking off regular lower body clothing?: A Lot 6 Click Score: 13    End of Session Equipment Utilized During Treatment: Gait belt  OT Visit Diagnosis: Other abnormalities of gait and mobility (R26.89);Muscle weakness (generalized) (M62.81)   Activity Tolerance Patient tolerated treatment well   Patient Left in bed;with call bell/phone within reach;with bed alarm set   Nurse Communication          Time: 1440-1510 OT Time Calculation (min): 30 min  Charges: OT General Charges $OT Visit: 1 Visit OT Treatments $Self Care/Home Management : 23-37 mins  Harrel Carina, MS, OTR/L  Harrel Carina 10/21/2019, 3:24 PM

## 2019-10-22 DIAGNOSIS — M6282 Rhabdomyolysis: Principal | ICD-10-CM

## 2019-10-22 DIAGNOSIS — J690 Pneumonitis due to inhalation of food and vomit: Secondary | ICD-10-CM

## 2019-10-22 DIAGNOSIS — R531 Weakness: Secondary | ICD-10-CM

## 2019-10-22 DIAGNOSIS — R1310 Dysphagia, unspecified: Secondary | ICD-10-CM

## 2019-10-22 LAB — GLUCOSE, CAPILLARY: Glucose-Capillary: 124 mg/dL — ABNORMAL HIGH (ref 70–99)

## 2019-10-22 NOTE — Progress Notes (Addendum)
PT had foley removed prior to start of shift. Currently pt has not void pt bladder scanned by myself. Scanned pt 4 times all readings resulted with less than 200 ml of urine. NP has been notified of lack of void as well as of bladder scan results. New orders provided. Will Continue to monitor
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
## 2019-10-22 NOTE — Progress Notes (Signed)
SLP Cancellation Note  Patient Details Name: BENICIO MANNA MRN: [id number] DOB: August 29, 1966   Cancelled treatment:       Reason Eval/Treat Not Completed: Fatigue/lethargy limiting ability to participate(chart reviewed; consulted NSG re: pt's status)  Pt was sleeping and when awakened, he refused po's and Lunch meal offered by SLP. NSG reported pt had been awake earlier this AM taking sips of tin liquids - holding cup himself w/ min support to take sips. NSG reported no overt s/s of aspiration during intake or w/ pills in Puree today. Recommend continued aspiration precautions including f/u, Dry swallows and atternating foods/liquids to aid oral and pharyngeal clearing as per MBSS. Recommend continued Pureed diet until pt is more awake and consistently engaging in his meals; improved oral strength for bolus mastication/clearing. ST services will f/u at a meal w/ pt is eating for education on aspiration precautions. NSG agreed.     Orinda Kenner, MS, CCC-SLP Emanuele Mcwhirter 10/22/2019, 3:24 PM

## 2019-10-22 NOTE — Progress Notes (Signed)
Bladder scan volume of 661ml. Per Dr. Fritzi Mandes, in and out cath x1. If patient unable to void post in and out cath, place foley. Madlyn Frankel, RN

## 2019-10-22 NOTE — Progress Notes (Signed)
Physical Therapy Treatment Patient Details Name: [name] MRN: [id number] DOB: [birth year] Today's Date: 10/22/2019    History of Present Illness Pt. is a 53 y.o. male who presented to ER secondary to SOB, progressive weakness and numbness in bilat hands, LEs; admitted for TIA/CVA work up. All intracranial imaging negative for acute process at this time. C-spine and L-spine negative for acute spinal pathology.    PT Comments    Not progressing toward goals -noted decline in functional ability, requiring increased assist, demonstrating decreased ability this session.  Supine/sit x2, max/total assist +2--attempted to facilitate awareness of midline in A/P plane and general relaxation in upright position.  Patient consistently demonstrating forward trunk lean, requiring dep assist +2 to prevent anterior LOB.  Absent balance/righting reactions; very high risk for fall/injury.  Of note, patient reporting "I can't breathe" once upright--noted desat to 80% on RA, requiring return to supine for recovery 89-90%.  RN informed/aware and at bedside for assessment. Unsafe/unable to attempt additional mobility at this time.   Continue to recommend STR at discharge, as patient clearly unsafe/unable to manage at home alone for any period of time.  Anticipate total care for all aspects of care at this time.   Follow Up Recommendations  SNF     Equipment Recommendations       Recommendations for Other Services       Precautions / Restrictions Precautions Precautions: Fall Restrictions Weight Bearing Restrictions: No    Mobility  Bed Mobility Overal bed mobility: Needs Assistance Bed Mobility: Supine to Sit;Sit to Supine     Supine to sit: Max assist;Total assist Sit to supine: Total assist;+2 for physical assistance   General bed mobility comments: total assist for LE management and truncal elevation; attempts to assist with UEs, but unable to adequately lift trunk from bed  surface  Transfers                 General transfer comment: unsafe/unable  Ambulation/Gait             General Gait Details: unsafe/unable   Stairs             Wheelchair Mobility    Modified Rankin (Stroke Patients Only)       Balance Overall balance assessment: Needs assistance Sitting-balance support: No upper extremity supported;Feet supported Sitting balance-Leahy Scale: Zero Sitting balance - Comments: generally stiff/rigid trunk posturing with absent balance/righting reactions, dep assist +1-2 to maintain upright       Standing balance comment: unsafe/unable                            Cognition Arousal/Alertness: Awake/alert Behavior During Therapy: Flat affect Overall Cognitive Status: Within Functional Limits for tasks assessed                                        Exercises Other Exercises Other Exercises: Supine/sit x2, max/total assist +2--attempted to facilitate awareness of midline in A/P plane and general relaxation in upright position.  Patient consistently demonstrating forward trunk lean, requiring dep assist +2 to prevent anterior LOB.  Absent balance/righting reactions; very high risk for fall/injury.  Of note, patient reporting "I can't breathe" once upright--noted desat to 80% on RA, requiring return to supine for recovery 89-90%.  RN informed/aware and at bedside for assessment.    General Comments  Pertinent Vitals/Pain Pain Assessment: Faces Faces Pain Scale: Hurts little more Pain Location: R hip Pain Descriptors / Indicators: Aching;Grimacing Pain Intervention(s): Limited activity within patient's tolerance;Monitored during session;Repositioned    Home Living                      Prior Function            PT Goals (current goals can now be found in the care plan section) Acute Rehab PT Goals Patient Stated Goal: To get better PT Goal Formulation: With patient Time  For Goal Achievement: 10/31/19 Potential to Achieve Goals: Fair Progress towards PT goals: Not progressing toward goals - comment(noted decline in functional ability, requiring increased assist, demonstrating decreased ability this session)    Frequency    Min 2X/week      PT Plan Current plan remains appropriate    Co-evaluation              AM-PAC PT "6 Clicks" Mobility   Outcome Measure  Help needed turning from your back to your side while in a flat bed without using bedrails?: Total Help needed moving from lying on your back to sitting on the side of a flat bed without using bedrails?: Total Help needed moving to and from a bed to a chair (including a wheelchair)?: Total Help needed standing up from a chair using your arms (e.g., wheelchair or bedside chair)?: Total Help needed to walk in hospital room?: Total Help needed climbing 3-5 steps with a railing? : Total 6 Click Score: 6    End of Session   Activity Tolerance: Treatment limited secondary to medical complications (Comment) Patient left: in bed;with call bell/phone within reach;with bed alarm set Nurse Communication: Mobility status PT Visit Diagnosis: Muscle weakness (generalized) (M62.81);Difficulty in walking, not elsewhere classified (R26.2);Repeated falls (R29.6);Other symptoms and signs involving the nervous system (I69.629)     Time: 5284-1324 PT Time Calculation (min) (ACUTE ONLY): 29 min  Charges:  $Therapeutic Activity: 23-37 mins                     Couper Juncaj H. Manson Passey, PT, DPT, NCS 10/22/19, 8:48 PM [phone]
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
## 2019-10-23 MED ORDER — LIDOCAINE HCL URETHRAL/MUCOSAL 2 % EX GEL
1.0000 "application " | Freq: Once | CUTANEOUS | Status: AC
  Administered 2019-10-23: 1 via URETHRAL
  Filled 2019-10-23: qty 5

## 2019-10-23 MED ORDER — MAGNESIUM HYDROXIDE 400 MG/5ML PO SUSP
30.0000 mL | Freq: Two times a day (BID) | ORAL | Status: AC
  Administered 2019-10-23 – 2019-10-24 (×2): 30 mL via ORAL
  Filled 2019-10-23 (×3): qty 30

## 2019-10-23 MED ORDER — CHLORHEXIDINE GLUCONATE CLOTH 2 % EX PADS
6.0000 | MEDICATED_PAD | Freq: Every day | CUTANEOUS | Status: DC
  Administered 2019-10-23 – 2019-10-25 (×3): 6 via TOPICAL

## 2019-10-23 NOTE — Progress Notes (Signed)
Patient ID: [name], male   DOB: 22-Dec-1965, 53 y.o.   MRN: [id number] Spoke with Dter Arby Barrette at length

## 2019-10-23 NOTE — Progress Notes (Signed)
Per MD, Serita Grit, may use lidocaine for foley insertion

## 2019-10-23 NOTE — Progress Notes (Signed)
Physical Therapy Treatment Patient Details Name: [name] MRN: [id number] DOB: 01-14-1966 Today's Date: 10/23/2019    History of Present Illness Pt. is a 53 y.o. male who presented to ER secondary to SOB, progressive weakness and numbness in bilat hands, LEs; admitted for TIA/CVA work up. All intracranial imaging negative for acute process at this time. C-spine and L-spine negative for acute spinal pathology.    PT Comments    Noted improvement in level of alertness, vocal quality/articulation and active participation with session this date.  Continues to require max/dep assist +2 for bed mobility and transfers; however, does demonstrate increased movement of bilat UE/LEs with isolated ROM activities. Able to complete OOB to chair, scoot pivot over level surfaces, max/dep assist +2.  Initially very fearful of task, but agreeable with mod encouragement.  R lateral lean in all supported and unsupported sitting postures; corrects to midline with cuing. Vitals maintained WFL at rest and with activity this date.    Follow Up Recommendations  SNF     Equipment Recommendations       Recommendations for Other Services       Precautions / Restrictions Precautions Precautions: Fall Restrictions Weight Bearing Restrictions: No    Mobility  Bed Mobility Overal bed mobility: Needs Assistance Bed Mobility: Supine to Sit;Sit to Supine     Supine to sit: Max assist;Mod assist Sit to supine: Total assist   General bed mobility comments: improved ability to self-mobilize bilat LEs and assist with UE grasp, truncal elevation this date  Transfers                 General transfer comment: unsafe/unable  Ambulation/Gait             General Gait Details: unsafe/unable   Stairs             Wheelchair Mobility    Modified Rankin (Stroke Patients Only)       Balance Overall balance assessment: Needs assistance Sitting-balance support: No upper extremity  supported;Feet supported Sitting balance-Leahy Scale: Poor Sitting balance - Comments: excessive forward trunk lean, requiring UE support on RW and mod +1 to maintain balance/midline                                    Cognition Arousal/Alertness: Awake/alert Behavior During Therapy: WFL for tasks assessed/performed Overall Cognitive Status: Within Functional Limits for tasks assessed                                        Exercises Other Exercises Other Exercises: Unsupported sitting, participated with weight shifting activities to increase trunk control and orientation to midline. Consistnet mod assist with bilat UE support to maintain. Other Exercises: Bed/chair, lateral scoot over level surfaces, max/dep assist +2 for lift off, lateral movement and trunk control.    General Comments        Pertinent Vitals/Pain Pain Assessment: Faces Faces Pain Scale: Hurts a little bit Pain Location: R hip Pain Descriptors / Indicators: Grimacing Pain Intervention(s): Limited activity within patient's tolerance;Monitored during session;Repositioned    Home Living                      Prior Function            PT Goals (current goals can now be found in  the care plan section)      Frequency    Min 2X/week      PT Plan Current plan remains appropriate    Co-evaluation              AM-PAC PT "6 Clicks" Mobility   Outcome Measure  Help needed turning from your back to your side while in a flat bed without using bedrails?: Total Help needed moving from lying on your back to sitting on the side of a flat bed without using bedrails?: Total Help needed moving to and from a bed to a chair (including a wheelchair)?: Total Help needed standing up from a chair using your arms (e.g., wheelchair or bedside chair)?: Total Help needed to walk in hospital room?: Total Help needed climbing 3-5 steps with a railing? : Total 6 Click Score: 6     End of Session Equipment Utilized During Treatment: Gait belt Activity Tolerance: Patient tolerated treatment well Patient left: in chair;with call bell/phone within reach;with chair alarm set Nurse Communication: Mobility status PT Visit Diagnosis: Muscle weakness (generalized) (M62.81);Difficulty in walking, not elsewhere classified (R26.2);Repeated falls (R29.6);Other symptoms and signs involving the nervous system (D98.338)     Time: 2505-3976 PT Time Calculation (min) (ACUTE ONLY): 46 min  Charges:  $Therapeutic Activity: 38-52 mins                     Narissa Beaufort H. Owens Shark, PT, DPT, NCS 10/23/19, 9:48 PM [phone]
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
## 2019-10-24 LAB — ACETYLCHOLINE RECEPTOR AB, ALL
Acety choline binding ab: <0.03 nmol/L (ref 0.00–0.24)
Acetylchol Block Ab: 12 % (ref 0–25)
Acetylcholine Modulat Ab: <12 % (ref 0–20)

## 2019-10-24 MED ORDER — BOOST / RESOURCE BREEZE PO LIQD CUSTOM
1.0000 | Freq: Three times a day (TID) | ORAL | Status: DC
  Administered 2019-10-24: 1 via ORAL

## 2019-10-24 MED ORDER — CALCIUM CARBONATE ANTACID 500 MG PO CHEW
1.0000 | CHEWABLE_TABLET | Freq: Once | ORAL | Status: DC
  Filled 2019-10-24: qty 1

## 2019-10-24 MED ORDER — ADULT MULTIVITAMIN W/MINERALS CH
1.0000 | ORAL_TABLET | Freq: Every day | ORAL | Status: DC
  Administered 2019-10-24: 16:00:00 1 via ORAL
  Filled 2019-10-24 (×2): qty 1

## 2019-10-24 NOTE — TOC Progression Note (Signed)
Transition of Care [facility]) - Progression Note    Patient Details  Name: [name] MRN: [id number] Date of Birth: 01/13/1966  Transition of Care [facility]) CM/SW Contact  Shelbie Hutching, RN Phone Number: 10/24/2019, 10:07 AM  Clinical Narrative:    Plan will be for patient to discharge home with home health services tomorrow.  Patient has requested a hospital bed.   Sunday Corn with [place] contacted about the hospital bed, [employer] will have the patient fill out a financial hardship form, patient will just need to put a debit or credit card on file and have the paper work filled out and returned within 30 days.  Patient reports that he has a rollator at home and an IT trainer wheelchair.   [facility] been arranged with [facility] for RN, PT, OT, speech, and SW.   RNCM will talk with patient's daughter Arby Barrette later today, Arby Barrette will be helping with the care of her father at home.     Expected Discharge Plan: [place] Barriers to Discharge: Inadequate or no insurance  Expected Discharge Plan and Services Expected Discharge Plan: [place]   Discharge Planning Services: CM Consult Post Acute Care Choice: [place] arrangements for the past 2 months: Single Family Home                 DME Arranged: Hospital bed DME Agency: [employer] Date DME Agency Contacted: 10/24/19 Time DME Agency Contacted: 1005 Representative spoke with at DME Agency: Sunday Corn HH Arranged: RN, PT, OT, Social Work [place] Date [place]: 10/23/19 Time [place]: 0900 Representative spoke with at [place] Interventions    Readmission Risk Interventions Readmission Risk Prevention Plan 10/19/2019 10/19/2019  Post Dischage Appt Not Complete Not Complete  Appt Comments No DC order yet Patient not dischargted yet  Medication Screening (No Data) -   Transportation Screening Complete Complete  Some recent data might be hidden
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
## 2019-10-24 NOTE — Progress Notes (Signed)
Physical Therapy Treatment Patient Details Name: [name] MRN: [id number] DOB: [birth year] Today's Date: 10/24/2019    History of Present Illness Pt. is a 53 y.o. male who presented to ER secondary to SOB, progressive weakness and numbness in bilat hands, LEs; admitted for TIA/CVA work up. All intracranial imaging negative for acute process at this time. C-spine and L-spine negative for acute spinal pathology.    PT Comments    Small improvements in sitting balance, but unable to maintain well enough to integrate additional functional activity into sitting posture.  Did attempt standing this date; minimal/no palpable muscle contraction in bilat LEs with attempts. Continues to require extensive physical assist for all functional mobility.  STR most appropriate disposition for discharge.    Follow Up Recommendations  SNF     Equipment Recommendations       Recommendations for Other Services       Precautions / Restrictions Precautions Precautions: Fall Restrictions Weight Bearing Restrictions: No    Mobility  Bed Mobility Overal bed mobility: Needs Assistance Bed Mobility: Supine to Sit     Supine to sit: Max assist;+2 for physical assistance     General bed mobility comments: initiates movement of LEs towards edge of bed (L), but limited active grasp and functional use of UEs/trunk musculature  Transfers Overall transfer level: Needs assistance Equipment used: Rolling walker (2 wheeled) Transfers: Sit to/from Stand Sit to Stand: Total assist;+2 physical assistance         General transfer comment: unable to lift/clear buttocks from bed surface despite total assist +2 from therapist; very minimal/no palpable co-contraction of bilat quads/hams with standing attempts  Ambulation/Gait             General Gait Details: unsafe/unable   Stairs             Wheelchair Mobility    Modified Rankin (Stroke Patients Only)       Balance Overall  balance assessment: Needs assistance Sitting-balance support: No upper extremity supported;Feet supported Sitting balance-Leahy Scale: Poor Sitting balance - Comments: forward and R lateral trunk lean, limited trunk flexibility/dissociation                                    Cognition Arousal/Alertness: Awake/alert Behavior During Therapy: WFL for tasks assessed/performed Overall Cognitive Status: Within Functional Limits for tasks assessed                                        Exercises Other Exercises Other Exercises: Unsupported sitting, participated with weight shifting activities to increase trunk control and orientation to midline. Corrects with cuing, but does not initiate indep.  Maintains for brief periods with close sup, but min/mod assist required large majority of the time.    General Comments        Pertinent Vitals/Pain Pain Assessment: No/denies pain    Home Living                      Prior Function            PT Goals (current goals can now be found in the care plan section) Acute Rehab PT Goals Patient Stated Goal: To get better PT Goal Formulation: With patient Time For Goal Achievement: 10/31/19 Potential to Achieve Goals: Fair Progress towards PT goals: Not progressing  toward goals - comment    Frequency    Min 2X/week      PT Plan Current plan remains appropriate    Co-evaluation              AM-PAC PT "6 Clicks" Mobility   Outcome Measure  Help needed turning from your back to your side while in a flat bed without using bedrails?: Total Help needed moving from lying on your back to sitting on the side of a flat bed without using bedrails?: Total Help needed moving to and from a bed to a chair (including a wheelchair)?: Total Help needed standing up from a chair using your arms (e.g., wheelchair or bedside chair)?: Total Help needed to walk in hospital room?: Total Help needed climbing 3-5  steps with a railing? : Total 6 Click Score: 6    End of Session Equipment Utilized During Treatment: Gait belt Activity Tolerance: Patient tolerated treatment well Patient left: in bed;with call bell/phone within reach;with bed alarm set Nurse Communication: Mobility status PT Visit Diagnosis: Muscle weakness (generalized) (M62.81);Difficulty in walking, not elsewhere classified (R26.2);Repeated falls (R29.6);Other symptoms and signs involving the nervous system (I09.735)     Time: 3299-2426 PT Time Calculation (min) (ACUTE ONLY): 30 min  Charges:  $Therapeutic Activity: 23-37 mins                     Nijae Doyel H. Manson Passey, PT, DPT, NCS 10/24/19, 5:06 PM [phone]

## 2019-10-24 NOTE — Discharge Instructions (Addendum)
F/u with your Neurology appt at [facility] on your scheduled day  Diet recommendations: Dysphagia 1 (puree);Thin liquid Liquids provided via: Cup;Straw Medication Administration: Whole meds with puree(vs need to Crush for easier swallowing)

## 2019-10-24 NOTE — TOC Progression Note (Signed)
Transition of Care [facility]) - Progression Note    Patient Details  Name: [name] MRN: [id number] Date of Birth: 06-16-1966  [facility] of [facility]) CM/SW Contact  Shelbie Hutching, RN Phone Number: 10/24/2019, 3:00 PM  Clinical Narrative:    RNCM spoke with patient's daughter [name] via phone.  [name] is coming up to the hospital to visit this afternoon.  [name] reports that she will provide transportation for her father home tomorrow at discharge.  RNCM has arranged for hospital bed.  Patient will need to fill out financial hardship paperwork for the bed from [employer].  Sunday Corn with [employer] is handling the equipment- Leroy Sea reports that there should be no problem getting the bed delivered to the patient's home tomorrow.   [name] and other family members will provide care for her father once he is home.  Home health has been arranged through [facility], Cassie with [facility] is aware that patient is discharging tomorrow.  Family is aware that home health only comes out on scheduled visits 2-3 times per week, family will be responsible for patient care the rest of the time.    Expected Discharge Plan: [place] Barriers to Discharge: Inadequate or no insurance  Expected Discharge Plan and Services Expected Discharge Plan: [place]   Discharge Planning Services: CM Consult Post Acute Care Choice: [place] arrangements for the past 2 months: Single Family Home                 DME Arranged: Hospital bed DME Agency: [employer] Date DME Agency Contacted: 10/24/19 Time DME Agency Contacted: 1005 Representative spoke with at DME Agency: Sunday Corn HH Arranged: RN, PT, OT, Social Work [place] Date [place]: 10/23/19 Time [place]: 0900 Representative spoke with at [place] ([place]) Interventions    Readmission Risk Interventions Readmission Risk  Prevention Plan 10/19/2019 10/19/2019  Post Dischage Appt Not Complete Not Complete  Appt Comments No DC order yet Patient not dischargted yet  Medication Screening (No Data) -  Transportation Screening Complete Complete  Some recent data might be hidden

## 2019-10-24 NOTE — Progress Notes (Signed)
OT Cancellation Note  Patient Details Name: BAKARY BRAMER MRN: [id number] DOB: 01/07/1966   Cancelled Treatment:    Reason Eval/Treat Not Completed: Other (comment). MD in room to assess pt. Will re-attempt OT tx at later date/time as pt is available and medically appropriate.   Jeni Salles, MPH, MS, OTR/L ascom [phone] 10/24/19, 11:41 AM

## 2019-10-24 NOTE — TOC Progression Note (Signed)
Transition of Care [facility]) - Progression Note    Patient Details  Name: [name] MRN: [id number] Date of Birth: [birth year]  Transition of Care [facility]) CM/SW Contact  Shelbie Hutching, RN Phone Number: 10/24/2019, 4:32 PM  Clinical Narrative:    Patient reports that he wants EMS transport- patient states he will pay for it.     Expected Discharge Plan: [place] Barriers to Discharge: Inadequate or no insurance  Expected Discharge Plan and Services Expected Discharge Plan: [place]   Discharge Planning Services: CM Consult Post Acute Care Choice: [place] arrangements for the past 2 months: Single Family Home                 DME Arranged: Hospital bed DME Agency: [employer] Date DME Agency Contacted: 10/24/19 Time DME Agency Contacted: 1005 Representative spoke with at DME Agency: Sunday Corn HH Arranged: RN, PT, OT, Social Work [place] Date [place]: 10/23/19 Time [place]: 0900 Representative spoke with at [place] ([place]) Interventions    Readmission Risk Interventions Readmission Risk Prevention Plan 10/19/2019 10/19/2019  Post Dischage Appt Not Complete Not Complete  Appt Comments No DC order yet Patient not dischargted yet  Medication Screening (No Data) -  Transportation Screening Complete Complete  Some recent data might be hidden

## 2019-10-24 NOTE — Progress Notes (Signed)
Patient suffers from chronic neck pain which is caused by cervical myelopathy.  A hospital bed will alleviate pain by allowing the head and neck to be positioned in ways not feasible by a traditional bed.  Pain episodes frequently require immediate changes in body position which cannot be achieved by a normal bed.

## 2019-10-25 DIAGNOSIS — J9601 Acute respiratory failure with hypoxia: Secondary | ICD-10-CM

## 2019-10-25 LAB — BASIC METABOLIC PANEL
Anion gap: 7 (ref 5–15)
BUN: 10 mg/dL (ref 6–20)
CO2: 32 mmol/L (ref 22–32)
Calcium: 8 mg/dL — ABNORMAL LOW (ref 8.9–10.3)
Chloride: 96 mmol/L — ABNORMAL LOW (ref 98–111)
Creatinine, Ser: 0.57 mg/dL — ABNORMAL LOW (ref 0.61–1.24)
GFR calc Af Amer: >60 mL/min (ref >60)
GFR calc non Af Amer: >60 mL/min (ref >60)
Glucose, Bld: 113 mg/dL — ABNORMAL HIGH (ref 70–99)
Potassium: 3.5 mmol/L (ref 3.5–5.1)
Sodium: 135 mmol/L (ref 135–145)

## 2019-10-25 LAB — MAGNESIUM: Magnesium: 2 mg/dL (ref 1.7–2.4)

## 2019-10-25 LAB — TROPONIN I (HIGH SENSITIVITY)
Troponin I (High Sensitivity): 20 ng/L — ABNORMAL HIGH (ref <18)
Troponin I (High Sensitivity): 20 ng/L — ABNORMAL HIGH (ref <18)

## 2019-10-25 MED ORDER — LIDOCAINE VISCOUS HCL 2 % MT SOLN
15.0000 mL | Freq: Once | OROMUCOSAL | Status: DC
  Filled 2019-10-25: qty 15

## 2019-10-25 MED ORDER — AMOXICILLIN-POT CLAVULANATE 875-125 MG PO TABS: 1.0000 | ORAL_TABLET | Freq: Two times a day (BID) | ORAL | 0 refills | Status: DC

## 2019-10-25 MED ORDER — SODIUM CHLORIDE 0.9 % IV BOLUS
500.0000 mL | Freq: Once | INTRAVENOUS | Status: AC
  Administered 2019-10-25: 500 mL via INTRAVENOUS

## 2019-10-25 MED ORDER — ALUM & MAG HYDROXIDE-SIMETH 200-200-20 MG/5ML PO SUSP
30.0000 mL | Freq: Once | ORAL | Status: DC
  Filled 2019-10-25: qty 30

## 2019-10-25 MED ORDER — MORPHINE SULFATE (PF) 2 MG/ML IV SOLN
1.0000 mg | Freq: Once | INTRAVENOUS | Status: AC
  Administered 2019-10-25: 1 mg via INTRAVENOUS
  Filled 2019-10-25: qty 1

## 2019-10-25 MED ORDER — DOCUSATE SODIUM 100 MG PO CAPS: 100.0000 mg | ORAL_CAPSULE | Freq: Two times a day (BID) | ORAL | 0 refills | Status: DC

## 2019-10-25 MED ORDER — ESCITALOPRAM OXALATE 10 MG PO TABS: 10.0000 mg | ORAL_TABLET | Freq: Every day | ORAL | 0 refills | Status: DC

## 2019-10-25 NOTE — Progress Notes (Addendum)
Pt is being discharged home with [facility].  Discharge papers given and explained to pt.  Pt verbalized understanding.  Meds and f/u appointments reviewed. Rx to be picked up from pharmacy. Pt made aware. Awaiting EMS.

## 2019-10-25 NOTE — Progress Notes (Signed)
Pt Mews score is 2. L chest pain improved per pt.  Pt is resting and watching TV.  HR 115, rest of VSS. Pt is anxious today and has multiple requests since notified about discharge.  This Probation officer educated pt about the discharge plan and coordination of care.  Pt verbalized understanding.
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
## 2019-10-25 NOTE — Progress Notes (Signed)
Patient ID: [name], male   DOB: 05-26-1966, 53 y.o.   MRN: [id number] Pt did have some left upper chest pain. Pain reproducible on palpation. EKG showed Mild s tach. Pt other vitals appear ok.

## 2019-10-25 NOTE — TOC Transition Note (Addendum)
Transition of Care [facility]) - CM/SW Discharge Note   Patient Details  Name: [name] MRN: [id number] Date of Birth: Jun 04, 1966  Transition of [facility]) CM/SW Contact:  Latanya Maudlin, RN Phone Number: 10/25/2019, 9:56 AM   Clinical Narrative:  Patient to be discharged per MD order. Orders in place for home health services. Previous RNCM had set up charity home health through [facility]. Notified Cassie of discharge. DME hospital bed has been ordered from [facility]. Spoke with Leroy Sea today and they have communicated with daughter Arby Barrette to arrange delivery.Per patient preference  EMS will be used for transport.   Update 11/8 1520- Patient has called hospital operator about catheter management. I am unsure if bedside nursing educated patient on catheter management prior to discharge. I have contacted Cassie at [facility] to see if they can call patient to assist with questions.       Final next level of care: [place] Barriers to Discharge: No Barriers Identified   Patient Goals and CMS Choice Patient states their goals for this hospitalization and ongoing recovery are:: Get my disability CMS Medicare.gov Compare Post Acute Care list provided to:: Patient Choice offered to / list presented to : Patient  Discharge Placement                       Discharge Plan and Services   Discharge Planning Services: CM Consult Post Acute Care Choice: Home Health          DME Arranged: Hospital bed DME Agency: [employer] Date DME Agency Contacted: 10/24/19 Time DME Agency Contacted: 1005 Representative spoke with at DME Agency: [place] Arranged: RN, OT, PT, Nurse's Aide [place] Agency: [facility] [place] Date [place]: 10/25/19 Time [place]: [phone] Representative spoke with at [place]: [place] ([place]) Interventions     Readmission Risk Interventions Readmission Risk Prevention Plan 10/25/2019  10/19/2019 10/19/2019  Post Dischage Appt Complete Not Complete Not Complete  Appt Comments - No DC order yet Patient not dischargted yet  Medication Screening Complete (No Data) -  Transportation Screening Complete Complete Complete  Some recent data might be hidden

## 2019-10-30 ENCOUNTER — Encounter: Payer: Self-pay | Admitting: Urology

## 2019-10-30 ENCOUNTER — Ambulatory Visit: Payer: Self-pay | Admitting: Urology
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
## 2019-11-08 DIAGNOSIS — F1120 Opioid dependence, uncomplicated: Secondary | ICD-10-CM | POA: Insufficient documentation

## 2019-11-08 DIAGNOSIS — R339 Retention of urine, unspecified: Secondary | ICD-10-CM | POA: Insufficient documentation

## 2020-01-05 ENCOUNTER — Other Ambulatory Visit: Payer: Self-pay

## 2020-01-05 ENCOUNTER — Emergency Department
Admission: EM | Admit: 2020-01-05 | Discharge: 2020-01-05 | Disposition: A | Discharge status: Home / Self Care | Payer: Medicaid Other | Attending: Student in an Organized Health Care Education/Training Program | Admitting: Student in an Organized Health Care Education/Training Program

## 2020-01-05 ENCOUNTER — Encounter: Payer: Self-pay | Admitting: Emergency Medicine

## 2020-01-05 ENCOUNTER — Emergency Department: Payer: Medicaid Other

## 2020-01-05 DIAGNOSIS — R202 Paresthesia of skin: Secondary | ICD-10-CM | POA: Insufficient documentation

## 2020-01-05 DIAGNOSIS — G8929 Other chronic pain: Secondary | ICD-10-CM | POA: Insufficient documentation

## 2020-01-05 DIAGNOSIS — Z79899 Other long term (current) drug therapy: Secondary | ICD-10-CM | POA: Diagnosis not present

## 2020-01-05 DIAGNOSIS — Z87891 Personal history of nicotine dependence: Secondary | ICD-10-CM | POA: Insufficient documentation

## 2020-01-05 DIAGNOSIS — R531 Weakness: Secondary | ICD-10-CM | POA: Diagnosis present

## 2020-01-05 DIAGNOSIS — G629 Polyneuropathy, unspecified: Secondary | ICD-10-CM

## 2020-01-05 LAB — CBC WITH DIFFERENTIAL/PLATELET
Abs Immature Granulocytes: 0.02 10*3/uL (ref 0.00–0.07)
Basophils Absolute: 0 10*3/uL (ref 0.0–0.1)
Basophils Relative: 0 %
Eosinophils Absolute: 0.1 10*3/uL (ref 0.0–0.5)
Eosinophils Relative: 1 %
HCT: 46 % (ref 39.0–52.0)
Hemoglobin: 14.7 g/dL (ref 13.0–17.0)
Immature Granulocytes: 0 %
Lymphocytes Relative: 15 %
Lymphs Abs: 1.5 10*3/uL (ref 0.7–4.0)
MCH: 28.1 pg (ref 26.0–34.0)
MCHC: 32 g/dL (ref 30.0–36.0)
MCV: 87.8 fL (ref 80.0–100.0)
Monocytes Absolute: 0.5 10*3/uL (ref 0.1–1.0)
Monocytes Relative: 5 %
Neutro Abs: 7.5 10*3/uL (ref 1.7–7.7)
Neutrophils Relative %: 79 %
Platelets: 357 10*3/uL (ref 150–400)
RBC: 5.24 MIL/uL (ref 4.22–5.81)
RDW: 13 % (ref 11.5–15.5)
WBC: 9.5 10*3/uL (ref 4.0–10.5)
nRBC: 0 % (ref 0.0–0.2)

## 2020-01-05 LAB — URINALYSIS, COMPLETE (UACMP) WITH MICROSCOPIC
Bilirubin Urine: NEGATIVE
Glucose, UA: NEGATIVE mg/dL
Hgb urine dipstick: NEGATIVE
Ketones, ur: NEGATIVE mg/dL
Nitrite: POSITIVE — AB
Protein, ur: NEGATIVE mg/dL
Specific Gravity, Urine: 1.004 — ABNORMAL LOW (ref 1.005–1.030)
Squamous Epithelial / HPF: NONE SEEN (ref 0–5)
WBC, UA: >50 WBC/hpf — ABNORMAL HIGH (ref 0–5)
pH: 8 (ref 5.0–8.0)

## 2020-01-05 LAB — COMPREHENSIVE METABOLIC PANEL
ALT: 17 U/L (ref 0–44)
AST: 40 U/L (ref 15–41)
Albumin: 3.5 g/dL (ref 3.5–5.0)
Alkaline Phosphatase: 135 U/L — ABNORMAL HIGH (ref 38–126)
Anion gap: 11 (ref 5–15)
BUN: <5 mg/dL — ABNORMAL LOW (ref 6–20)
CO2: 31 mmol/L (ref 22–32)
Calcium: 9 mg/dL (ref 8.9–10.3)
Chloride: 99 mmol/L (ref 98–111)
Creatinine, Ser: 0.7 mg/dL (ref 0.61–1.24)
GFR calc Af Amer: >60 mL/min (ref >60)
GFR calc non Af Amer: >60 mL/min (ref >60)
Glucose, Bld: 104 mg/dL — ABNORMAL HIGH (ref 70–99)
Potassium: 4.1 mmol/L (ref 3.5–5.1)
Sodium: 141 mmol/L (ref 135–145)
Total Bilirubin: 0.6 mg/dL (ref 0.3–1.2)
Total Protein: 7.1 g/dL (ref 6.5–8.1)

## 2020-01-05 MED ORDER — CEPHALEXIN 500 MG PO CAPS: 500.0000 mg | ORAL_CAPSULE | Freq: Three times a day (TID) | ORAL | 0 refills | Status: AC

## 2020-01-05 MED ORDER — SODIUM CHLORIDE 0.9 % IV SOLN
1.0000 g | Freq: Once | INTRAVENOUS | Status: AC
  Administered 2020-01-05: 1 g via INTRAVENOUS
  Filled 2020-01-05: qty 10

## 2020-01-05 MED ORDER — OXYCODONE HCL 5 MG PO TABS: 5.0000 mg | ORAL_TABLET | Freq: Three times a day (TID) | ORAL | 0 refills | Status: AC | PRN

## 2020-01-05 MED ORDER — OXYCODONE HCL 5 MG PO TABS
20.0000 mg | ORAL_TABLET | ORAL | Status: DC | PRN
  Administered 2020-01-05 (×2): 20 mg via ORAL
  Filled 2020-01-05 (×2): qty 4

## 2020-01-05 MED ORDER — LIDOCAINE HCL URETHRAL/MUCOSAL 2 % EX GEL
1.0000 "application " | Freq: Once | CUTANEOUS | Status: AC
  Administered 2020-01-05: 1 via URETHRAL
  Filled 2020-01-05: qty 10

## 2020-01-05 NOTE — ED Notes (Signed)
Pt arrived with foley catheter in place. States he has had multiple foleys placed over the last few months due to recurrent UTIs. Patient states he does not want to keep a catheter and would rather try to go without one. Patient's old catheter removed per MD due to infection in urine, however, patient is currently refusing to have a new one placed. MD aware and states we will monitor bladder closely to assess for need for straight cath or foley replacement.

## 2020-01-05 NOTE — Evaluation (Signed)
Physical Therapy Evaluation Patient Details Name: [name] MRN: [id number] DOB: Aug 11, 1966 Today's Date: 01/05/2020   History of Present Illness  ERIK BURKETT is a 53yoM who [facility] c worsening chronic pain numbness and tingling from his "tips of toes to the top of his head. PT admitted with weakness and paresthesias in 4 limbs in Oct 2020, extensive workup unrevealing thus far. Pt DC to home, has been working with HHPT, RN, noted to recently have exacerbation of pain and fatigue.  Clinical Impression  Pt admitted with above diagnosis. Pt currently with functional limitations due to the deficits listed below (see "PT Problem List"). Upon entry, pt in bed, awake and agreeable to participate. The pt is alert and oriented x4, pleasant, conversational, and generally a good historian. Examination reveals weakness in 4 limbs, consistent pattern of greater weakness in distal segments and improved preservation in shoulders/hips. Pt also consistently more weak on left side, than right, albeit he does have an orthopedic PMH of left shoulder. Pt has absent light touch sensation about the legs and ABD (not assessed any higher) but reports to be able to sense pressure. 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 Pt has 10/10 resting pain in the entire body, ranging of ankles not painful, but ranging of hips with bilat end range pain in multiple planes. Author assessed rolling in bed as oper formed at home, noted fluent technique and use of bed rails. Functional mobility assessment demonstrates increased effort/time requirements, poor tolerance, and need for physical assistance, whereas the patient performed these at a higher level of independence PTA. Pt has been working with HHPT consistently since DC in November, and feels to be making progress, but globally there has been no significant change in level of assistance needed for ADL and level of independence with basic mobility needs. Pt  has not been successful at progressing standing and transfers. Pt unable to access medical care outside of the home. For the acuity and the severity of his deficits and impairment, a higher level of frequency and intensity is really more appropriate, hence I think a STR stay would off the pt the best opportunity for recovery function at this time. Frequency limitations with HHPT are a big limiting factor for this patient. Pt will benefit from skilled PT intervention to increase independence and safety with basic mobility in preparation for discharge to the venue listed below.       Follow Up Recommendations Supervision/Assistance - 24 hour;SNF;Supervision for mobility/OOB    Equipment Recommendations  None recommended by PT    Recommendations for Other Services       Precautions / Restrictions Precautions Precautions: Fall Restrictions Weight Bearing Restrictions: No      Mobility  Bed Mobility Overal bed mobility: Needs Assistance Bed Mobility: Rolling Rolling: Min assist         General bed mobility comments: author positions single LE into hooklying, cues to use legs for rolling, uses bedrail to pull self during rolling  Transfers Overall transfer level: (Not safe at this time; Pt with ankle contractures and significant BLE weakness that is chronic.)                  Ambulation/Gait                Stairs            Wheelchair Mobility    Modified Rankin (Stroke Patients Only)       Balance  Pertinent Vitals/Pain Pain Assessment: 0-10 Pain Score: 10-Worst pain ever Pain Location: "all over my body" Pain Intervention(s): Limited activity within patient's tolerance;Premedicated before session;Repositioned    Home Living Family/patient expects to be discharged to:: Private residence Living Arrangements: Alone;Children(DTR has been staying around the clock Alfred Levins)) Available Help at Discharge: Family;Available PRN/intermittently Type of Home: House Home Access: Stairs to enter Entrance Stairs-Rails: None Entrance Stairs-Number of Steps: 5 front, 3 back Home Layout: One level Home Equipment: Cane - single point;Wheelchair - power;Walker - 2 wheels;Bedside commode;Transport chair;Hospital bed Additional Comments: been working on bed/slideboard transfers recently to prepare for OP MD visits.    Prior Function Level of Independence: Needs assistance   Gait / Transfers Assistance Needed: bed bound since leaving in November; has not been able to successflly stand yet;  ADL's / Homemaking Assistance Needed: total care; has been self feeding at home since last visit slowly.        Hand Dominance   Dominant Hand: Right    Extremity/Trunk Assessment   MMT  Right  Left Special Notes  Gross Grip  Heavy Weakness Lt>Rt   Gross Finger Extension lacks full ROM at DIP d/t intrinsic hand weakness  Elbow Flexion 5/5 4+/5   Elbow Extension 4+/5 4/5   Shoulder Flexion 5/5 4/5 chronic orthopedic shoulder pain, weakness, instability.   Ankle DF  2/5 2/5 partial A/ROM d/t contracture  Knee Flexion   unable to test d/t pain c ranging  Knee Extension    unable to test d/t pain c ranging  Gross hip/knee extension 4/5 3+/5        ROM  Right  Left Special Notes  Hands/finger [facility]   Elbow flexion/extension [facility]   Shoulder Flexion P/ROM 165 100 Lt calvicular pec spasm, chronic ortho issues  Shoulder External rotation 90 75   Shoulder Internal Rotation 45 30        Ankle DF  -5 -20 contracture c full PF in resting  Knee extension  0 0   Knee flexion  ~120 ~120   Hip flexion  105 95 Pain at end range   Hip Internal Rotation 0 0 Pain at end range   Hip External Rotation  40 35 Pain at end range        Sensation   *pt reports to be full numb BUE and ABD, can feel pressure             Communication   Communication: No difficulties   Cognition Arousal/Alertness: Awake/alert Behavior During Therapy: WFL for tasks assessed/performed Overall Cognitive Status: Within Functional Limits for tasks assessed                                        General Comments      Exercises     Assessment/Plan    PT Assessment Patient needs continued PT services  PT Problem List Decreased strength;Decreased range of motion;Decreased balance;Decreased mobility;Decreased knowledge of use of DME       PT Treatment Interventions DME instruction;Balance training;Functional mobility training;Therapeutic activities;Therapeutic exercise;Patient/family education    PT Goals (Current goals can be found in the Care Plan section)  Acute Rehab PT Goals Patient Stated Goal: be able to perform slide board transfers to get to MD visits; better pain control. PT Goal Formulation: With patient Time For Goal Achievement: 01/19/20 Potential to Achieve Goals: Poor    Frequency  Min 2X/week   Barriers to discharge Inaccessible home environment      Co-evaluation               AM-PAC PT "6 Clicks" Mobility  Outcome Measure Help needed turning from your back to your side while in a flat bed without using bedrails?: Total Help needed moving from lying on your back to sitting on the side of a flat bed without using bedrails?: Total Help needed moving to and from a bed to a chair (including a wheelchair)?: Total Help needed standing up from a chair using your arms (e.g., wheelchair or bedside chair)?: Total Help needed to walk in hospital room?: Total Help needed climbing 3-5 steps with a railing? : Total 6 Click Score: 6    End of Session   Activity Tolerance: Patient tolerated treatment well;Patient limited by pain Patient left: in bed;with call bell/phone within reach;with nursing/sitter in room   PT Visit Diagnosis: Other abnormalities of gait and mobility (R26.89);Muscle weakness (generalized) (M62.81);Other symptoms  and signs involving the nervous system (R29.898)    Time: 8307-4600 PT Time Calculation (min) (ACUTE ONLY): 26 min   Charges:   PT Evaluation $PT Eval High Complexity: 1 High         4:45 PM, 01/05/20 Rosamaria Lints, PT, DPT Physical Therapist - [facility]  [phone] (ASCOM)    Dyane Broberg C 01/05/2020, 4:35 PM
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
## 2020-01-05 NOTE — ED Triage Notes (Signed)
Patient from home via [facility]. Patient reports increasing weakness for the last 4 days. Patient denies N/V/D. Reports increased chronic pain as well.
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
## 2020-01-05 NOTE — Care Management (Signed)
TOC CM: Call to [facility] @ [phone] notifying patient is in ED and will require continued home health at discharge. S/w Marylene Land who requested callback @ [phone] when patient discharges so they can arrange HHC visit.

## 2020-01-07 LAB — URINE CULTURE: Culture: >=100000 — AB

## 2020-02-02 ENCOUNTER — Encounter: Payer: Self-pay | Admitting: Emergency Medicine

## 2020-02-02 ENCOUNTER — Emergency Department
Admission: EM | Admit: 2020-02-02 | Discharge: 2020-02-02 | Disposition: A | Discharge status: Home / Self Care | Payer: Medicaid Other | Attending: Student | Admitting: Student

## 2020-02-02 DIAGNOSIS — Z87891 Personal history of nicotine dependence: Secondary | ICD-10-CM | POA: Diagnosis not present

## 2020-02-02 DIAGNOSIS — Z960 Presence of urogenital implants: Secondary | ICD-10-CM | POA: Insufficient documentation

## 2020-02-02 DIAGNOSIS — Z79899 Other long term (current) drug therapy: Secondary | ICD-10-CM | POA: Diagnosis not present

## 2020-02-02 DIAGNOSIS — G8929 Other chronic pain: Secondary | ICD-10-CM | POA: Diagnosis not present

## 2020-02-02 DIAGNOSIS — R82998 Other abnormal findings in urine: Secondary | ICD-10-CM | POA: Insufficient documentation

## 2020-02-02 DIAGNOSIS — B372 Candidiasis of skin and nail: Secondary | ICD-10-CM | POA: Diagnosis not present

## 2020-02-02 DIAGNOSIS — R8290 Unspecified abnormal findings in urine: Secondary | ICD-10-CM

## 2020-02-02 DIAGNOSIS — R21 Rash and other nonspecific skin eruption: Secondary | ICD-10-CM | POA: Diagnosis present

## 2020-02-02 LAB — URINALYSIS, COMPLETE (UACMP) WITH MICROSCOPIC
Bilirubin Urine: NEGATIVE
Glucose, UA: NEGATIVE mg/dL
Hgb urine dipstick: NEGATIVE
Ketones, ur: NEGATIVE mg/dL
Leukocytes,Ua: NEGATIVE
Nitrite: NEGATIVE
Protein, ur: NEGATIVE mg/dL
Specific Gravity, Urine: 1.001 — ABNORMAL LOW (ref 1.005–1.030)
pH: 7 (ref 5.0–8.0)

## 2020-02-02 MED ORDER — KETOCONAZOLE 2 % EX CREA: 1.0000 "application " | TOPICAL_CREAM | Freq: Every day | CUTANEOUS | 0 refills | Status: DC

## 2020-02-02 MED ORDER — KETOCONAZOLE 2 % EX CREA: 1.0000 "application " | TOPICAL_CREAM | Freq: Every day | CUTANEOUS | 0 refills | Status: AC

## 2020-02-02 MED ORDER — OXYCODONE HCL 5 MG PO TABS
15.0000 mg | ORAL_TABLET | Freq: Once | ORAL | Status: AC
  Administered 2020-02-02: 15 mg via ORAL
  Filled 2020-02-02: qty 3

## 2020-02-02 MED ORDER — DIPHENHYDRAMINE HCL 25 MG PO CAPS
25.0000 mg | ORAL_CAPSULE | Freq: Once | ORAL | Status: AC
  Administered 2020-02-02: 25 mg via ORAL
  Filled 2020-02-02: qty 1
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
## 2020-02-02 NOTE — Discharge Instructions (Signed)
Thank you for letting us take care of you in the emergency department today.   Please continue to take any regular, prescribed medications.   New medications we have prescribed:  - Ketoconazole cream - try using this cream instead of the Nystatin to help with your yeast dermatitis. Apply to the area once daily for 2 weeks.  You can also take over the counter allergy medication (such as Zyrtec, Claritin, or Benadryl) as directed to help w/ itching.   Please follow up with: - Your primary care doctor to review your ER visit and follow up on your symptoms.   Please return to the ER for any new or worsening symptoms.

## 2020-02-02 NOTE — ED Triage Notes (Signed)
Pt to ED by [facility] with c/o of chronic pain. Pt states he has multiple "sores" as well and states has been unable to get any relief from the pain.

## 2020-02-02 NOTE — ED Notes (Addendum)
Pt verbalized understanding of discharge instructions. Pt in NAD at this time. Pt unable to sign for discharge due to neuropathy. Pt verbalized agreement to discharge.

## 2020-02-03 DIAGNOSIS — G629 Polyneuropathy, unspecified: Secondary | ICD-10-CM | POA: Insufficient documentation

## 2020-02-03 LAB — URINE CULTURE: Culture: NO GROWTH

## 2020-02-13 ENCOUNTER — Ambulatory Visit: Payer: Medicaid Other

## 2020-02-18 ENCOUNTER — Telehealth: Payer: Self-pay | Admitting: Pharmacy Technician

## 2020-02-18 NOTE — Telephone Encounter (Signed)
Received updated proof of income.  Patient eligible to receive medication assistance at [facility] until time for re-certification in 9359, and as long as eligibility requirements continue to be met.  [facility]

## 2020-03-02 ENCOUNTER — Other Ambulatory Visit: Payer: Self-pay

## 2020-03-02 ENCOUNTER — Encounter: Payer: Self-pay | Admitting: Emergency Medicine

## 2020-03-02 ENCOUNTER — Emergency Department
Admission: EM | Admit: 2020-03-02 | Discharge: 2020-03-02 | Disposition: A | Discharge status: Home / Self Care | Payer: Medicaid Other | Attending: Emergency Medicine | Admitting: Emergency Medicine

## 2020-03-02 DIAGNOSIS — Z87891 Personal history of nicotine dependence: Secondary | ICD-10-CM | POA: Diagnosis not present

## 2020-03-02 DIAGNOSIS — Z79899 Other long term (current) drug therapy: Secondary | ICD-10-CM | POA: Diagnosis not present

## 2020-03-02 DIAGNOSIS — G8929 Other chronic pain: Secondary | ICD-10-CM | POA: Diagnosis present

## 2020-03-02 DIAGNOSIS — G894 Chronic pain syndrome: Secondary | ICD-10-CM | POA: Diagnosis not present

## 2020-03-02 MED ORDER — DICLOFENAC SODIUM 1 % EX GEL
2.0000 g | CUTANEOUS | Status: AC
  Administered 2020-03-02: 2 g via TOPICAL
  Filled 2020-03-02: qty 100

## 2020-03-02 MED ORDER — OXYCODONE HCL 5 MG PO TABS
15.0000 mg | ORAL_TABLET | ORAL | Status: AC
  Administered 2020-03-02: 15 mg via ORAL
  Filled 2020-03-02: qty 3
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
## 2020-03-02 NOTE — Discharge Instructions (Signed)
Emergency care providers appreciate that many patients coming to us are in severe pain and we wish to address their pain in the safest, most responsible manner.  It is important to recognize, however, that the proper treatment of chronic pain differs from that of the pain of injuries and acute illnesses.  Our goal is to provider quality, safe, personalized care and we thank you for giving us the opportunity to serve you.  The use of narcotics and related agents for chronic pain syndromes may lead to additional physical and psychological problems.  Nearly as many people die from prescription narcotics each year as die from car crashes.  Additionally, this risk is increased if such prescriptions are obtained from a variety of sources.  Therefore, only your primary care physician or a pain management specialist is able to safely treat such syndromes with narcotic medications long-term.  Documentation revealing such prescriptions have been sought from multiple sources may prohibit us from providing a refill or different narcotic medication.  Your name may be checked first through the [place] Controlled Substances Reporting System.  This database is a record of controlled substance medication prescriptions that the patient has received.  This has been established by [facility] in an effort to eliminate the dangerous, and often life-threatening, practice of obtaining multiple prescriptions from different medical providers.  If you have a chronic pain syndrome (i.e. chronic headaches, recurrent back or neck pain, dental pain, abdominal or pelvic pain without a specific diagnosis, or neuropathic pain such as fibromyalgia) or recurrent visits for the same condition without an acute diagnosis, you may be treated with non-narcotics and other non-addictive medicines.  Allergic reactions or negative side effects that may be reported by a patient to such medications will not typically lead to the use of a  narcotic analgesic or other controlled substance as an alternative.  Patients managing chronic pain with a personal physician should have provisions in place for breakthrough pain.  If you are in crisis, you should call your physician.  If your physician directs you to the emergency department, please have the doctor call and speak to our attending physician concerning your care.  When patients come to the Emergency Department (ED) with acute medical conditions in which the ED physician feels it is appropriate to prescribe narcotic or sedating pain medication, the physician will prescribe these in very limited quantities.  The amount of these medications will last only until you can see your primary care physician in his/her office.  Any patient who returns to the ED seeking refills should expect only non-narcotic pain medications.  In the event an acute medical condition exists and the emergency physician feels it is necessary that the patient be given a narcotic or sedating medication, a responsible adult driver should be present in the room prior to the medication being given by the nurse.  Prescriptions for narcotic or sedating medications that have been lost, stolen, or expired will NOT be refilled in the ED.  Patients who have chronic pain may receive non-narcotic prescriptions until seen by their primary care physician.  It is every patient's personal responsibility to maintain active prescriptions with his or her primary care physician or specialist.  

## 2020-03-02 NOTE — ED Notes (Signed)
EDP at bedside giving pt an update with this RN present

## 2020-03-02 NOTE — ED Notes (Signed)
Pt reports called and told daughter EMS here to take him home.

## 2020-03-02 NOTE — ED Notes (Signed)
EMS arrived to take pt home.

## 2020-03-02 NOTE — ED Notes (Signed)
Pt c/o left foot pain. Dr Scotty Court notified. Pt given drink and crackers.
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
## 2020-03-02 NOTE — TOC Transition Note (Signed)
Transition of Care [facility]) - CM/SW Discharge Note   Patient Details  Name: [name] MRN: [id number] Date of Birth: 1966/01/21  Transition of Care [facility]) CM/SW Contact:  Marina Goodell Phone Number: [phone] 03/02/2020, 10:28 AM   Clinical Narrative:    Patient will be to his daughter's house [name] [phone].  EMS will transport, daughter has been notified.         Patient Goals and CMS Choice        Discharge Placement                       Discharge Plan and Services                                     Social Determinants of Health (SDOH) Interventions     Readmission Risk Interventions Readmission Risk Prevention Plan 10/25/2019 10/19/2019 10/19/2019  Post Dischage Appt Complete Not Complete Not Complete  Appt Comments - No DC order yet Patient not dischargted yet  Medication Screening Complete (No Data) -  Transportation Screening Complete Complete Complete  Some recent data might be hidden

## 2020-03-02 NOTE — ED Triage Notes (Addendum)
Pt arrived to ED via EMS from home with c/o continued chronic pain to his extremities. Pt has hx of neuropathy and chronic pain. Seen in this ED for the same about a month ago. Pt alert and talkative in triage. No acute distress noted at this time. Pt states he took a 15 mg immediate release oxy, and a 30 mg morphine extended relief and a flexeril. Prior to arrival.

## 2020-03-02 NOTE — ED Notes (Signed)
Daughter called Child psychotherapist and reports that she would rather EMS bring pt home.  Secretary notified and EMS transported contacted to provide way home
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
## 2020-03-02 NOTE — ED Notes (Signed)
Pt c/o pain stating "I haven't had anything in the last 4 hours". Pt reports taking his daily pain medication at home with no relief of sx. Pt st his pain is a 10/10 and is requesting a beverage.   Pt was given such with MD permission and EDP was notified of pt's pain

## 2020-03-02 NOTE — TOC Progression Note (Signed)
Transition of Care [facility]) - Progression Note    Patient Details  Name: [name] MRN: [id number] Date of Birth: 08/15/1966  Transition of Care [facility]) CM/SW Contact  Marina Goodell Phone Number: [phone] 03/02/2020, 9:32 AM  Clinical Narrative:    Received call from patient's daughter [name] [phone], with concerns about patient's transportation.  Ms. Selina Cooley states she can pick her father up from the Ed but she is concerned about her ability to help him from the car to the house.  This SW informed Ms. Selina Cooley that EMS non-emergency transport could be set up but there may be a transport charge for the patient.  Ms. Selina Cooley stated she understood but was still concerned about helping the patient inside the house.  This SW stated I would speak with Vikki Ports, RN ED and talk to her about transportation for the patient and call Ms. Stack back with a reply.  This SW spoke with Vikki Ports RN ED and she stated she already had this conversation with the patient and she was under the impression Ms. Selina Cooley was coming to pick up the patient.  This Sw stated she would contact Ms. Selina Cooley and call Vikki Ports RN ED back.    This SW contacted Ms. Selina Cooley and relayed the information from [employer] ED. Ms. Selina Cooley stated she would contact her father and call this SW back with a decision on transportation.        Expected Discharge Plan and Services                                                 Social Determinants of Health (SDOH) Interventions    Readmission Risk Interventions Readmission Risk Prevention Plan 10/25/2019 10/19/2019 10/19/2019  Post Dischage Appt Complete Not Complete Not Complete  Appt Comments - No DC order yet Patient not dischargted yet  Medication Screening Complete (No Data) -  Transportation Screening Complete Complete Complete  Some recent data might be hidden

## 2020-04-05 ENCOUNTER — Other Ambulatory Visit: Payer: Self-pay

## 2020-04-07 ENCOUNTER — Encounter: Payer: Self-pay | Admitting: Emergency Medicine

## 2020-04-07 ENCOUNTER — Emergency Department
Admission: EM | Admit: 2020-04-07 | Discharge: 2020-04-07 | Disposition: A | Discharge status: Other Acute Inpatient Hospital | Payer: Medicaid Other | Attending: Student | Admitting: Student

## 2020-04-07 ENCOUNTER — Other Ambulatory Visit: Payer: Self-pay

## 2020-04-07 ENCOUNTER — Emergency Department: Payer: Medicaid Other

## 2020-04-07 DIAGNOSIS — Z87891 Personal history of nicotine dependence: Secondary | ICD-10-CM | POA: Insufficient documentation

## 2020-04-07 DIAGNOSIS — Y999 Unspecified external cause status: Secondary | ICD-10-CM | POA: Insufficient documentation

## 2020-04-07 DIAGNOSIS — Z20822 Contact with and (suspected) exposure to covid-19: Secondary | ICD-10-CM | POA: Diagnosis not present

## 2020-04-07 DIAGNOSIS — S72002A Fracture of unspecified part of neck of left femur, initial encounter for closed fracture: Secondary | ICD-10-CM

## 2020-04-07 DIAGNOSIS — S72045A Nondisplaced fracture of base of neck of left femur, initial encounter for closed fracture: Secondary | ICD-10-CM | POA: Diagnosis not present

## 2020-04-07 DIAGNOSIS — Y939 Activity, unspecified: Secondary | ICD-10-CM | POA: Diagnosis not present

## 2020-04-07 DIAGNOSIS — Y929 Unspecified place or not applicable: Secondary | ICD-10-CM | POA: Diagnosis not present

## 2020-04-07 DIAGNOSIS — W19XXXA Unspecified fall, initial encounter: Secondary | ICD-10-CM | POA: Insufficient documentation

## 2020-04-07 DIAGNOSIS — S79912A Unspecified injury of left hip, initial encounter: Secondary | ICD-10-CM | POA: Diagnosis present

## 2020-04-07 HISTORY — DX: Other chronic pain: G89.29

## 2020-04-07 HISTORY — DX: Unspecified intracranial injury with loss of consciousness of unspecified duration, initial encounter: S06.9X9A

## 2020-04-07 HISTORY — DX: Post-traumatic stress disorder, unspecified: F43.10

## 2020-04-07 HISTORY — DX: Dissociative and conversion disorder, unspecified: F44.9

## 2020-04-07 HISTORY — DX: Unspecified intracranial injury with loss of consciousness status unknown, initial encounter: S06.9XAA

## 2020-04-07 LAB — CBC WITH DIFFERENTIAL/PLATELET
Abs Immature Granulocytes: 0.01 10*3/uL (ref 0.00–0.07)
Basophils Absolute: 0 10*3/uL (ref 0.0–0.1)
Basophils Relative: 0 %
Eosinophils Absolute: 0.2 10*3/uL (ref 0.0–0.5)
Eosinophils Relative: 3 %
HCT: 37.7 % — ABNORMAL LOW (ref 39.0–52.0)
Hemoglobin: 12.2 g/dL — ABNORMAL LOW (ref 13.0–17.0)
Immature Granulocytes: 0 %
Lymphocytes Relative: 15 %
Lymphs Abs: 1.1 10*3/uL (ref 0.7–4.0)
MCH: 28.7 pg (ref 26.0–34.0)
MCHC: 32.4 g/dL (ref 30.0–36.0)
MCV: 88.7 fL (ref 80.0–100.0)
Monocytes Absolute: 0.5 10*3/uL (ref 0.1–1.0)
Monocytes Relative: 7 %
Neutro Abs: 5.5 10*3/uL (ref 1.7–7.7)
Neutrophils Relative %: 75 %
Platelets: 329 10*3/uL (ref 150–400)
RBC: 4.25 MIL/uL (ref 4.22–5.81)
RDW: 14.6 % (ref 11.5–15.5)
WBC: 7.3 10*3/uL (ref 4.0–10.5)
nRBC: 0 % (ref 0.0–0.2)

## 2020-04-07 LAB — COMPREHENSIVE METABOLIC PANEL
ALT: 18 U/L (ref 0–44)
AST: 46 U/L — ABNORMAL HIGH (ref 15–41)
Albumin: 3 g/dL — ABNORMAL LOW (ref 3.5–5.0)
Alkaline Phosphatase: 144 U/L — ABNORMAL HIGH (ref 38–126)
Anion gap: 5 (ref 5–15)
BUN: 6 mg/dL (ref 6–20)
CO2: 31 mmol/L (ref 22–32)
Calcium: 8.5 mg/dL — ABNORMAL LOW (ref 8.9–10.3)
Chloride: 102 mmol/L (ref 98–111)
Creatinine, Ser: 0.77 mg/dL (ref 0.61–1.24)
GFR calc Af Amer: >60 mL/min (ref >60)
GFR calc non Af Amer: >60 mL/min (ref >60)
Glucose, Bld: 80 mg/dL (ref 70–99)
Potassium: 4 mmol/L (ref 3.5–5.1)
Sodium: 138 mmol/L (ref 135–145)
Total Bilirubin: 0.5 mg/dL (ref 0.3–1.2)
Total Protein: 6.6 g/dL (ref 6.5–8.1)

## 2020-04-07 LAB — PROTIME-INR
INR: 1.1 (ref 0.8–1.2)
Prothrombin Time: 13.9 seconds (ref 11.4–15.2)

## 2020-04-07 LAB — CK: Total CK: 45 U/L — ABNORMAL LOW (ref 49–397)

## 2020-04-07 LAB — RESPIRATORY PANEL BY RT PCR (FLU A&B, COVID)
Influenza A by PCR: NEGATIVE
Influenza B by PCR: NEGATIVE
SARS Coronavirus 2 by RT PCR: NEGATIVE

## 2020-04-07 MED ORDER — MORPHINE SULFATE (PF) 4 MG/ML IV SOLN
4.0000 mg | Freq: Once | INTRAVENOUS | Status: AC
  Administered 2020-04-07: 4 mg via INTRAVENOUS
  Filled 2020-04-07: qty 1

## 2020-04-07 MED ORDER — ONDANSETRON HCL 4 MG/2ML IJ SOLN
4.0000 mg | Freq: Once | INTRAMUSCULAR | Status: AC
  Administered 2020-04-07: 4 mg via INTRAVENOUS
  Filled 2020-04-07: qty 2

## 2020-04-07 NOTE — ED Triage Notes (Signed)
Fall couple weeks ago.   having pain left groin/hip area since.  Hard time standing.  Was able to stand and pivot on the good leg. Vss.

## 2020-04-07 NOTE — ED Notes (Signed)
Attempted to call report to [facility] [phone] at this time. Unable to take report due to unconfirmed transport, per nurse, call back when transport is picking up patient.

## 2020-04-07 NOTE — ED Notes (Signed)
Report given to [facility], Animator

## 2020-04-07 NOTE — ED Notes (Signed)
Attempted to call report to [facility], was told RN in isolation room and would call RN back when available

## 2020-04-07 NOTE — ED Notes (Signed)
Pt taken to imaging

## 2020-04-07 NOTE — ED Notes (Signed)
Shauna from [facility] called states pt has a bed assignment at [facility] at [address] [place], [place] [address].  Call report number is [phone].

## 2020-04-07 NOTE — ED Notes (Signed)
Shauna from [facility] called states pt has a bed assignment at [facility] at [address] [place], [place] [address].  Call report number is [phone].  The number to [facility] is [phone] if needed for transport.
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
## 2020-04-07 NOTE — ED Notes (Signed)
Orthopedic surgeon at bedside. 

## 2020-04-08 SURGERY — FIXATION, FEMUR, NECK, PERCUTANEOUS, USING SCREW
Anesthesia: Choice | Site: Hip | Laterality: Left

## 2020-04-22 DIAGNOSIS — M8080XD Other osteoporosis with current pathological fracture, unspecified site, subsequent encounter for fracture with routine healing: Secondary | ICD-10-CM | POA: Insufficient documentation

## 2020-04-22 DIAGNOSIS — Z800 Family history of malignant neoplasm of digestive organs: Secondary | ICD-10-CM | POA: Insufficient documentation

## 2020-05-03 ENCOUNTER — Other Ambulatory Visit: Payer: Self-pay

## 2020-05-03 ENCOUNTER — Emergency Department
Admission: EM | Admit: 2020-05-03 | Discharge: 2020-05-04 | Disposition: A | Discharge status: Home / Self Care | Payer: Medicaid Other | Attending: Emergency Medicine | Admitting: Emergency Medicine

## 2020-05-03 ENCOUNTER — Emergency Department: Payer: Medicaid Other

## 2020-05-03 ENCOUNTER — Encounter: Payer: Self-pay | Admitting: Emergency Medicine

## 2020-05-03 DIAGNOSIS — M25552 Pain in left hip: Secondary | ICD-10-CM | POA: Diagnosis not present

## 2020-05-03 DIAGNOSIS — Z87891 Personal history of nicotine dependence: Secondary | ICD-10-CM | POA: Diagnosis not present

## 2020-05-03 DIAGNOSIS — Z79899 Other long term (current) drug therapy: Secondary | ICD-10-CM | POA: Diagnosis not present

## 2020-05-03 DIAGNOSIS — M545 Low back pain, unspecified: Secondary | ICD-10-CM

## 2020-05-03 LAB — CBC WITH DIFFERENTIAL/PLATELET
Abs Immature Granulocytes: 0.01 10*3/uL (ref 0.00–0.07)
Basophils Absolute: 0 10*3/uL (ref 0.0–0.1)
Basophils Relative: 1 %
Eosinophils Absolute: 0.2 10*3/uL (ref 0.0–0.5)
Eosinophils Relative: 3 %
HCT: 34.1 % — ABNORMAL LOW (ref 39.0–52.0)
Hemoglobin: 11.3 g/dL — ABNORMAL LOW (ref 13.0–17.0)
Immature Granulocytes: 0 %
Lymphocytes Relative: 29 %
Lymphs Abs: 2.2 10*3/uL (ref 0.7–4.0)
MCH: 29.3 pg (ref 26.0–34.0)
MCHC: 33.1 g/dL (ref 30.0–36.0)
MCV: 88.3 fL (ref 80.0–100.0)
Monocytes Absolute: 0.6 10*3/uL (ref 0.1–1.0)
Monocytes Relative: 8 %
Neutro Abs: 4.5 10*3/uL (ref 1.7–7.7)
Neutrophils Relative %: 59 %
Platelets: 264 10*3/uL (ref 150–400)
RBC: 3.86 MIL/uL — ABNORMAL LOW (ref 4.22–5.81)
RDW: 13.8 % (ref 11.5–15.5)
WBC: 7.6 10*3/uL (ref 4.0–10.5)
nRBC: 0 % (ref 0.0–0.2)

## 2020-05-03 LAB — COMPREHENSIVE METABOLIC PANEL
ALT: 15 U/L (ref 0–44)
AST: 31 U/L (ref 15–41)
Albumin: 3.2 g/dL — ABNORMAL LOW (ref 3.5–5.0)
Alkaline Phosphatase: 110 U/L (ref 38–126)
Anion gap: 6 (ref 5–15)
BUN: 6 mg/dL (ref 6–20)
CO2: 29 mmol/L (ref 22–32)
Calcium: 8.2 mg/dL — ABNORMAL LOW (ref 8.9–10.3)
Chloride: 102 mmol/L (ref 98–111)
Creatinine, Ser: 0.65 mg/dL (ref 0.61–1.24)
GFR calc Af Amer: >60 mL/min (ref >60)
GFR calc non Af Amer: >60 mL/min (ref >60)
Glucose, Bld: 83 mg/dL (ref 70–99)
Potassium: 3.2 mmol/L — ABNORMAL LOW (ref 3.5–5.1)
Sodium: 137 mmol/L (ref 135–145)
Total Bilirubin: 0.5 mg/dL (ref 0.3–1.2)
Total Protein: 6 g/dL — ABNORMAL LOW (ref 6.5–8.1)

## 2020-05-03 MED ORDER — ONDANSETRON 4 MG PO TBDP
4.0000 mg | ORAL_TABLET | Freq: Once | ORAL | Status: AC
  Administered 2020-05-03: 4 mg via ORAL
  Filled 2020-05-03: qty 1

## 2020-05-03 MED ORDER — HYDROMORPHONE HCL 1 MG/ML IJ SOLN
1.0000 mg | Freq: Once | INTRAMUSCULAR | Status: DC
  Filled 2020-05-03: qty 1

## 2020-05-03 MED ORDER — HYDROMORPHONE HCL 1 MG/ML IJ SOLN
1.0000 mg | Freq: Once | INTRAMUSCULAR | Status: AC
  Administered 2020-05-03: 1 mg via INTRAVENOUS

## 2020-05-03 NOTE — ED Notes (Signed)
Pt transported to xray 

## 2020-05-03 NOTE — ED Notes (Signed)
See triage notes.  Pt arrives to the room in a wheelchair, states his pain is more than a 10/10 at this time and states he is feeling nauseous from the pain.
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
## 2020-05-03 NOTE — ED Notes (Signed)
[facility] unable to transport at this time per phone conversation. Pt in nonambulatory and needs assistance getting into his house per report. Will arrange EMS transport.

## 2020-05-04 MED ORDER — IBUPROFEN 800 MG PO TABS
800.0000 mg | ORAL_TABLET | Freq: Once | ORAL | Status: AC
  Administered 2020-05-04: 800 mg via ORAL
  Filled 2020-05-04: qty 1

## 2020-05-04 NOTE — ED Notes (Signed)
Pt is resting in hall waiting for EMS transport back to home.

## 2020-05-04 NOTE — ED Notes (Signed)
Previous RN gave pt discharge paperwork and had him sign discharge paperwork. EMS took pt home.

## 2020-05-04 NOTE — ED Notes (Signed)
Called [facility] @[phone]  waiting on [facility]

## 2020-05-05 ENCOUNTER — Emergency Department: Payer: Medicaid Other

## 2020-05-05 ENCOUNTER — Emergency Department
Admission: EM | Admit: 2020-05-05 | Discharge: 2020-05-05 | Disposition: A | Discharge status: Home / Self Care | Payer: Medicaid Other | Attending: Student | Admitting: Student

## 2020-05-05 ENCOUNTER — Other Ambulatory Visit: Payer: Self-pay

## 2020-05-05 DIAGNOSIS — M79605 Pain in left leg: Secondary | ICD-10-CM | POA: Diagnosis present

## 2020-05-05 DIAGNOSIS — Z5321 Procedure and treatment not carried out due to patient leaving prior to being seen by health care provider: Secondary | ICD-10-CM | POA: Insufficient documentation

## 2020-05-05 DIAGNOSIS — R2242 Localized swelling, mass and lump, left lower limb: Secondary | ICD-10-CM | POA: Insufficient documentation

## 2020-05-05 NOTE — ED Notes (Signed)
Pt states he wants this RN to call EMS to transport him home. Advised pt that he does not meet criteria to be transported home by EMS per the medical necessity form and pt is leaving AMA. Pt has been ambulatory for hours in the lobby with a cane. Pt states that ems helps him up his steps. Advised pt that first responders will assist people in getting up there steps. Advise pt that he can call a cab or a friend or an uber. If he wants to leave. This RN has made multiple attempts at getting pt back to a room but not success.

## 2020-05-05 NOTE — ED Notes (Signed)
Pedal pulses palpable in bilateral feet

## 2020-05-05 NOTE — ED Notes (Signed)
Pt called with no answer. Family called to speak with this RN about pt. Advised daughter that pt had walked out but that a room was now available for him but there was no answer when called.
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
## 2020-05-05 NOTE — ED Provider Notes (Signed)
Patient presented to emergency department complaining of left groin, leg pain, edema.  Patient was triaged, ultrasound performed.  Prior to being placed in a room patient apparently left the emergency department without informing staff.  I have not seen, assessed or treated the patient at this time.   Lanette Hampshire 05/05/20 2203    Miguel Aschoff., MD 05/06/20 [phone]

## 2020-05-05 NOTE — ED Triage Notes (Signed)
Pt arrives via [facility] from home for reports of left leg pain and swelling that is worsening since his femur surgery 3 weeks ago. PT reports he fell twice this week and was told that he fractured 2 vertebrae. Pt c/o left groin pain, left leg pain and back pain. PT in NAD at this time.

## 2020-05-05 NOTE — ED Notes (Signed)
Discussed patient presentation with Dr. Marisa Severin, verbal order given for u/s left leg to rule out DVT

## 2020-08-16 ENCOUNTER — Telehealth: Payer: Self-pay | Admitting: Pharmacy Technician

## 2020-08-16 NOTE — Telephone Encounter (Signed)
Patient has Medicaid with prescription drug coverage.  No longer meets the eligibility criteria for [facility]  Patient requested [facility] transfer prescriptions to [facility].  Sammuel Hines to transfer prescriptions to [facility].  Sherilyn Dacosta Care Manager [facility]

## 2020-11-12 ENCOUNTER — Other Ambulatory Visit: Payer: Self-pay

## 2020-11-12 ENCOUNTER — Emergency Department
Admission: EM | Admit: 2020-11-12 | Discharge: 2020-11-12 | Discharge status: Against Medical Advice | Payer: Medicaid Other | Attending: Emergency Medicine | Admitting: Emergency Medicine

## 2020-11-12 DIAGNOSIS — R220 Localized swelling, mass and lump, head: Secondary | ICD-10-CM | POA: Diagnosis not present

## 2020-11-12 DIAGNOSIS — J029 Acute pharyngitis, unspecified: Secondary | ICD-10-CM | POA: Diagnosis not present

## 2020-11-12 DIAGNOSIS — Z5321 Procedure and treatment not carried out due to patient leaving prior to being seen by health care provider: Secondary | ICD-10-CM | POA: Diagnosis not present

## 2020-11-12 LAB — CBC
HCT: 42.6 % (ref 39.0–52.0)
Hemoglobin: 14 g/dL (ref 13.0–17.0)
MCH: 28.7 pg (ref 26.0–34.0)
MCHC: 32.9 g/dL (ref 30.0–36.0)
MCV: 87.5 fL (ref 80.0–100.0)
Platelets: 347 10*3/uL (ref 150–400)
RBC: 4.87 MIL/uL (ref 4.22–5.81)
RDW: 12 % (ref 11.5–15.5)
WBC: 14.2 10*3/uL — ABNORMAL HIGH (ref 4.0–10.5)
nRBC: 0 % (ref 0.0–0.2)

## 2020-11-12 LAB — BASIC METABOLIC PANEL
Anion gap: 12 (ref 5–15)
BUN: 13 mg/dL (ref 6–20)
CO2: 30 mmol/L (ref 22–32)
Calcium: 9.1 mg/dL (ref 8.9–10.3)
Chloride: 87 mmol/L — ABNORMAL LOW (ref 98–111)
Creatinine, Ser: 0.85 mg/dL (ref 0.61–1.24)
GFR, Estimated: >60 mL/min (ref >60)
Glucose, Bld: 172 mg/dL — ABNORMAL HIGH (ref 70–99)
Potassium: 3.9 mmol/L (ref 3.5–5.1)
Sodium: 129 mmol/L — ABNORMAL LOW (ref 135–145)

## 2020-11-12 NOTE — ED Triage Notes (Addendum)
Pt comes via POV from home with c/o pain when swallowing and swelling noted to left side of face for over a week. Pt states it might be a bad tooth but unsure. Pt has noticeable swelling to left side of face. Pt states it feels like razor blades when he swallows. Pt able to swallow saliva.  Pt denies any N/V. Pt states dark colored urine as well.

## 2020-11-15 ENCOUNTER — Telehealth: Payer: Self-pay | Admitting: Emergency Medicine

## 2020-11-15 NOTE — Telephone Encounter (Signed)
Called patient due to left emergency department before provider exam to inquire about condition and follow up plans. Left message. 

## 2021-01-07 DIAGNOSIS — Z87820 Personal history of traumatic brain injury: Secondary | ICD-10-CM

## 2021-01-07 DIAGNOSIS — Z20822 Contact with and (suspected) exposure to covid-19: Secondary | ICD-10-CM | POA: Diagnosis present

## 2021-01-07 DIAGNOSIS — Z87891 Personal history of nicotine dependence: Secondary | ICD-10-CM

## 2021-01-07 DIAGNOSIS — Z96612 Presence of left artificial shoulder joint: Secondary | ICD-10-CM | POA: Diagnosis present

## 2021-01-07 DIAGNOSIS — M1991 Primary osteoarthritis, unspecified site: Secondary | ICD-10-CM | POA: Diagnosis present

## 2021-01-07 DIAGNOSIS — M25552 Pain in left hip: Principal | ICD-10-CM | POA: Diagnosis present

## 2021-01-07 DIAGNOSIS — Z881 Allergy status to other antibiotic agents status: Secondary | ICD-10-CM

## 2021-01-07 DIAGNOSIS — Z79899 Other long term (current) drug therapy: Secondary | ICD-10-CM

## 2021-01-07 DIAGNOSIS — W010XXA Fall on same level from slipping, tripping and stumbling without subsequent striking against object, initial encounter: Secondary | ICD-10-CM | POA: Diagnosis present

## 2021-01-07 DIAGNOSIS — J9601 Acute respiratory failure with hypoxia: Secondary | ICD-10-CM | POA: Diagnosis present

## 2021-01-07 DIAGNOSIS — F4310 Post-traumatic stress disorder, unspecified: Secondary | ICD-10-CM | POA: Diagnosis present

## 2021-01-07 DIAGNOSIS — G959 Disease of spinal cord, unspecified: Secondary | ICD-10-CM | POA: Diagnosis present

## 2021-01-07 DIAGNOSIS — Z888 Allergy status to other drugs, medicaments and biological substances status: Secondary | ICD-10-CM

## 2021-01-07 DIAGNOSIS — Z96642 Presence of left artificial hip joint: Secondary | ICD-10-CM | POA: Diagnosis present

## 2021-01-07 DIAGNOSIS — G894 Chronic pain syndrome: Secondary | ICD-10-CM | POA: Diagnosis present

## 2021-01-07 DIAGNOSIS — G629 Polyneuropathy, unspecified: Secondary | ICD-10-CM | POA: Diagnosis present

## 2021-01-07 DIAGNOSIS — Z8249 Family history of ischemic heart disease and other diseases of the circulatory system: Secondary | ICD-10-CM

## 2021-01-07 DIAGNOSIS — S79912A Unspecified injury of left hip, initial encounter: Secondary | ICD-10-CM | POA: Diagnosis not present

## 2021-01-07 DIAGNOSIS — K219 Gastro-esophageal reflux disease without esophagitis: Secondary | ICD-10-CM | POA: Diagnosis present

## 2021-01-07 DIAGNOSIS — F331 Major depressive disorder, recurrent, moderate: Secondary | ICD-10-CM | POA: Diagnosis present

## 2021-01-07 DIAGNOSIS — Z96611 Presence of right artificial shoulder joint: Secondary | ICD-10-CM | POA: Diagnosis present

## 2021-01-08 ENCOUNTER — Emergency Department: Payer: Medicare HMO

## 2021-01-08 ENCOUNTER — Encounter: Payer: Self-pay | Admitting: Emergency Medicine

## 2021-01-08 ENCOUNTER — Inpatient Hospital Stay
Admission: EM | Admit: 2021-01-08 | Discharge: 2021-01-10 | DRG: 555 | Disposition: A | Discharge status: Home Health | Payer: Medicare HMO | Attending: Internal Medicine | Admitting: Internal Medicine

## 2021-01-08 ENCOUNTER — Inpatient Hospital Stay: Payer: Medicare HMO

## 2021-01-08 ENCOUNTER — Other Ambulatory Visit: Payer: Self-pay

## 2021-01-08 DIAGNOSIS — F4310 Post-traumatic stress disorder, unspecified: Secondary | ICD-10-CM | POA: Diagnosis present

## 2021-01-08 DIAGNOSIS — R55 Syncope and collapse: Secondary | ICD-10-CM | POA: Diagnosis present

## 2021-01-08 DIAGNOSIS — I361 Nonrheumatic tricuspid (valve) insufficiency: Secondary | ICD-10-CM | POA: Diagnosis not present

## 2021-01-08 DIAGNOSIS — W19XXXD Unspecified fall, subsequent encounter: Secondary | ICD-10-CM

## 2021-01-08 DIAGNOSIS — Z96642 Presence of left artificial hip joint: Secondary | ICD-10-CM | POA: Diagnosis present

## 2021-01-08 DIAGNOSIS — R0902 Hypoxemia: Secondary | ICD-10-CM

## 2021-01-08 DIAGNOSIS — S79912A Unspecified injury of left hip, initial encounter: Secondary | ICD-10-CM | POA: Diagnosis present

## 2021-01-08 DIAGNOSIS — R7989 Other specified abnormal findings of blood chemistry: Secondary | ICD-10-CM

## 2021-01-08 DIAGNOSIS — F331 Major depressive disorder, recurrent, moderate: Secondary | ICD-10-CM | POA: Diagnosis present

## 2021-01-08 DIAGNOSIS — G629 Polyneuropathy, unspecified: Secondary | ICD-10-CM | POA: Diagnosis present

## 2021-01-08 DIAGNOSIS — J9601 Acute respiratory failure with hypoxia: Secondary | ICD-10-CM | POA: Diagnosis present

## 2021-01-08 DIAGNOSIS — J189 Pneumonia, unspecified organism: Secondary | ICD-10-CM

## 2021-01-08 DIAGNOSIS — Z888 Allergy status to other drugs, medicaments and biological substances status: Secondary | ICD-10-CM | POA: Diagnosis not present

## 2021-01-08 DIAGNOSIS — G959 Disease of spinal cord, unspecified: Secondary | ICD-10-CM | POA: Diagnosis present

## 2021-01-08 DIAGNOSIS — Z87820 Personal history of traumatic brain injury: Secondary | ICD-10-CM | POA: Diagnosis not present

## 2021-01-08 DIAGNOSIS — Z8249 Family history of ischemic heart disease and other diseases of the circulatory system: Secondary | ICD-10-CM | POA: Diagnosis not present

## 2021-01-08 DIAGNOSIS — R531 Weakness: Secondary | ICD-10-CM

## 2021-01-08 DIAGNOSIS — Z20822 Contact with and (suspected) exposure to covid-19: Secondary | ICD-10-CM | POA: Diagnosis present

## 2021-01-08 DIAGNOSIS — M25552 Pain in left hip: Principal | ICD-10-CM | POA: Diagnosis present

## 2021-01-08 DIAGNOSIS — W19XXXA Unspecified fall, initial encounter: Secondary | ICD-10-CM | POA: Diagnosis present

## 2021-01-08 DIAGNOSIS — Z881 Allergy status to other antibiotic agents status: Secondary | ICD-10-CM | POA: Diagnosis not present

## 2021-01-08 DIAGNOSIS — R0602 Shortness of breath: Secondary | ICD-10-CM

## 2021-01-08 DIAGNOSIS — G894 Chronic pain syndrome: Secondary | ICD-10-CM | POA: Diagnosis present

## 2021-01-08 DIAGNOSIS — R918 Other nonspecific abnormal finding of lung field: Secondary | ICD-10-CM | POA: Diagnosis present

## 2021-01-08 DIAGNOSIS — Z79899 Other long term (current) drug therapy: Secondary | ICD-10-CM | POA: Diagnosis not present

## 2021-01-08 DIAGNOSIS — R636 Underweight: Secondary | ICD-10-CM | POA: Diagnosis not present

## 2021-01-08 DIAGNOSIS — W010XXA Fall on same level from slipping, tripping and stumbling without subsequent striking against object, initial encounter: Secondary | ICD-10-CM | POA: Diagnosis present

## 2021-01-08 DIAGNOSIS — K219 Gastro-esophageal reflux disease without esophagitis: Secondary | ICD-10-CM | POA: Diagnosis present

## 2021-01-08 DIAGNOSIS — M1991 Primary osteoarthritis, unspecified site: Secondary | ICD-10-CM | POA: Diagnosis present

## 2021-01-08 DIAGNOSIS — Z96611 Presence of right artificial shoulder joint: Secondary | ICD-10-CM | POA: Diagnosis present

## 2021-01-08 DIAGNOSIS — Z96612 Presence of left artificial shoulder joint: Secondary | ICD-10-CM | POA: Diagnosis present

## 2021-01-08 DIAGNOSIS — Z87891 Personal history of nicotine dependence: Secondary | ICD-10-CM | POA: Diagnosis not present

## 2021-01-08 LAB — POC SARS CORONAVIRUS 2 AG -  ED: SARS Coronavirus 2 Ag: NEGATIVE

## 2021-01-08 LAB — URINALYSIS, COMPLETE (UACMP) WITH MICROSCOPIC
Bacteria, UA: NONE SEEN
Bilirubin Urine: NEGATIVE
Glucose, UA: NEGATIVE mg/dL
Hgb urine dipstick: NEGATIVE
Ketones, ur: NEGATIVE mg/dL
Leukocytes,Ua: NEGATIVE
Nitrite: NEGATIVE
Protein, ur: NEGATIVE mg/dL
Specific Gravity, Urine: 1.004 — ABNORMAL LOW (ref 1.005–1.030)
Squamous Epithelial / HPF: NONE SEEN (ref 0–5)
pH: 7 (ref 5.0–8.0)

## 2021-01-08 LAB — CBC
HCT: 33.9 % — ABNORMAL LOW (ref 39.0–52.0)
Hemoglobin: 10.8 g/dL — ABNORMAL LOW (ref 13.0–17.0)
MCH: 28.9 pg (ref 26.0–34.0)
MCHC: 31.9 g/dL (ref 30.0–36.0)
MCV: 90.6 fL (ref 80.0–100.0)
Platelets: 251 10*3/uL (ref 150–400)
RBC: 3.74 MIL/uL — ABNORMAL LOW (ref 4.22–5.81)
RDW: 12.4 % (ref 11.5–15.5)
WBC: 8.1 10*3/uL (ref 4.0–10.5)
nRBC: 0 % (ref 0.0–0.2)

## 2021-01-08 LAB — PROCALCITONIN: Procalcitonin: <0.1 ng/mL

## 2021-01-08 LAB — BASIC METABOLIC PANEL
Anion gap: 14 (ref 5–15)
BUN: 7 mg/dL (ref 6–20)
CO2: 27 mmol/L (ref 22–32)
Calcium: 8.1 mg/dL — ABNORMAL LOW (ref 8.9–10.3)
Chloride: 98 mmol/L (ref 98–111)
Creatinine, Ser: 0.87 mg/dL (ref 0.61–1.24)
GFR, Estimated: >60 mL/min (ref >60)
Glucose, Bld: 82 mg/dL (ref 70–99)
Potassium: 4.1 mmol/L (ref 3.5–5.1)
Sodium: 139 mmol/L (ref 135–145)

## 2021-01-08 LAB — LACTIC ACID, PLASMA
Lactic Acid, Venous: 1.1 mmol/L (ref 0.5–1.9)
Lactic Acid, Venous: 0.9 mmol/L (ref 0.5–1.9)

## 2021-01-08 LAB — SARS CORONAVIRUS 2 BY RT PCR (HOSPITAL ORDER, PERFORMED IN ~~LOC~~ HOSPITAL LAB): SARS Coronavirus 2: NEGATIVE

## 2021-01-08 LAB — FERRITIN: Ferritin: 200 ng/mL (ref 24–336)

## 2021-01-08 LAB — C-REACTIVE PROTEIN: CRP: 3.5 mg/dL — ABNORMAL HIGH (ref <1.0)

## 2021-01-08 LAB — FIBRIN DERIVATIVES D-DIMER (ARMC ONLY): Fibrin derivatives D-dimer (ARMC): 665.69 ng/mL (FEU) — ABNORMAL HIGH (ref 0.00–499.00)

## 2021-01-08 LAB — FIBRINOGEN: Fibrinogen: 457 mg/dL (ref 210–475)

## 2021-01-08 MED ORDER — OXYCODONE HCL 5 MG PO TABS
15.0000 mg | ORAL_TABLET | Freq: Four times a day (QID) | ORAL | Status: DC
  Administered 2021-01-08 – 2021-01-10 (×6): 15 mg via ORAL
  Filled 2021-01-08 (×7): qty 3

## 2021-01-08 MED ORDER — MORPHINE SULFATE ER 30 MG PO TBCR: 30.0000 mg | EXTENDED_RELEASE_TABLET | Freq: Two times a day (BID) | ORAL | Status: DC

## 2021-01-08 MED ORDER — FENTANYL 12 MCG/HR TD PT72
1.0000 | MEDICATED_PATCH | TRANSDERMAL | Status: DC
  Administered 2021-01-08: 1 via TRANSDERMAL
  Filled 2021-01-08: qty 1

## 2021-01-08 MED ORDER — DIAZEPAM 2 MG PO TABS
2.0000 mg | ORAL_TABLET | Freq: Every day | ORAL | Status: DC
  Administered 2021-01-08 – 2021-01-09 (×2): 2 mg via ORAL
  Filled 2021-01-08 (×2): qty 1

## 2021-01-08 MED ORDER — SODIUM CHLORIDE 0.9 % IV SOLN
1.0000 g | Freq: Once | INTRAVENOUS | Status: AC
  Administered 2021-01-08: 1 g via INTRAVENOUS
  Filled 2021-01-08: qty 10

## 2021-01-08 MED ORDER — OXYCODONE HCL 5 MG PO TABS
15.0000 mg | ORAL_TABLET | Freq: Four times a day (QID) | ORAL | Status: DC
  Administered 2021-01-08 (×2): 15 mg via ORAL
  Filled 2021-01-08 (×2): qty 3

## 2021-01-08 MED ORDER — ONDANSETRON HCL 4 MG/2ML IJ SOLN: 4.0000 mg | Freq: Four times a day (QID) | INTRAMUSCULAR | Status: DC | PRN

## 2021-01-08 MED ORDER — FLUTICASONE PROPIONATE 50 MCG/ACT NA SUSP
1.0000 | Freq: Every day | NASAL | Status: DC | PRN
  Administered 2021-01-08: 1 via NASAL
  Filled 2021-01-08 (×2): qty 16

## 2021-01-08 MED ORDER — FLUOXETINE HCL 20 MG PO CAPS
60.0000 mg | ORAL_CAPSULE | Freq: Every day | ORAL | Status: DC
  Administered 2021-01-08 – 2021-01-10 (×3): 60 mg via ORAL
  Filled 2021-01-08 (×3): qty 3

## 2021-01-08 MED ORDER — DOXYCYCLINE HYCLATE 100 MG PO TABS
100.0000 mg | ORAL_TABLET | Freq: Once | ORAL | Status: AC
  Administered 2021-01-08: 100 mg via ORAL
  Filled 2021-01-08: qty 1

## 2021-01-08 MED ORDER — PANTOPRAZOLE SODIUM 40 MG PO TBEC
40.0000 mg | DELAYED_RELEASE_TABLET | Freq: Every day | ORAL | Status: DC
  Administered 2021-01-08 – 2021-01-10 (×3): 40 mg via ORAL
  Filled 2021-01-08 (×3): qty 1

## 2021-01-08 MED ORDER — ONDANSETRON HCL 4 MG PO TABS: 4.0000 mg | ORAL_TABLET | Freq: Four times a day (QID) | ORAL | Status: DC | PRN

## 2021-01-08 MED ORDER — VITAMIN B-12 1000 MCG PO TABS
1000.0000 ug | ORAL_TABLET | Freq: Every day | ORAL | Status: DC
  Administered 2021-01-08 – 2021-01-10 (×3): 1000 ug via ORAL
  Filled 2021-01-08 (×3): qty 1

## 2021-01-08 MED ORDER — OXYCODONE HCL 5 MG PO TABS
15.0000 mg | ORAL_TABLET | Freq: Once | ORAL | Status: AC
Start: 2021-01-08 — End: 2021-01-08
  Administered 2021-01-08: 15 mg via ORAL
  Filled 2021-01-08: qty 3

## 2021-01-08 MED ORDER — SODIUM CHLORIDE 0.9 % IV BOLUS
1000.0000 mL | Freq: Once | INTRAVENOUS | Status: AC
  Administered 2021-01-08: 1000 mL via INTRAVENOUS

## 2021-01-08 MED ORDER — CYCLOBENZAPRINE HCL 10 MG PO TABS
10.0000 mg | ORAL_TABLET | Freq: Three times a day (TID) | ORAL | Status: DC | PRN
  Filled 2021-01-08: qty 1

## 2021-01-08 MED ORDER — METOPROLOL TARTRATE 25 MG PO TABS
12.5000 mg | ORAL_TABLET | Freq: Every day | ORAL | Status: DC
  Administered 2021-01-09 – 2021-01-10 (×2): 12.5 mg via ORAL
  Filled 2021-01-08 (×2): qty 1

## 2021-01-08 MED ORDER — FENTANYL 50 MCG/HR TD PT72
1.0000 | MEDICATED_PATCH | TRANSDERMAL | Status: DC
  Administered 2021-01-08: 1 via TRANSDERMAL
  Filled 2021-01-08: qty 1

## 2021-01-08 NOTE — Progress Notes (Signed)
Chart reviewed. Pt visited. Nsg in the room and reports Pt passed swallow screen and regular diet was started. Pt also denies any swallowing problems. Secure chat sent to MD who advised to discontinue swallow eval order.

## 2021-01-08 NOTE — Progress Notes (Signed)
Pt passed swallow study with no issues.

## 2021-01-08 NOTE — ED Triage Notes (Signed)
Pt reports falling x 2 today with LOC on the second fall at which time he hit his head. Pt is able to talk in complete sentences at this time. Pt also c/o of left hip pain and reports the same hip being replaced approximately one year ago.

## 2021-01-08 NOTE — Progress Notes (Signed)
Messaged Dr. Joylene Igo starting at 1511 re: patient's headache pain and left hip pain. Several orders were received for pt's home meds.   Last two hours patient's headache has sustained at a "9" and I let Dr. Joylene Igo know. No orders received at first and then patient said his headache is "out of control and if I don't get help for this headache with SOMETHING then I'm going to [facility]". Dr. Joylene Igo notified and increased his oxycodone to 5x daily (which he already takes 4x daily at home). I notified Dr. Joylene Igo that this is a different pain then the pain he takes all the oxycodone for at home. No further orders received. Patient willing to take oxycodone however states, "I hope this helps my headache but if it doesn't, I'm talking to the doctor in charge b/c I've been in pain for so long now." Pt took the extra oxycodone and will wait to see if it helps his headache. Will notify night nurse to make sure pt gets some pain relief.

## 2021-01-08 NOTE — ED Notes (Addendum)
Dr. York Cerise notified of bp 85/52 and oxygen saturations of 85%. New orders received at this time.
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
## 2021-01-08 NOTE — ED Notes (Signed)
Taken to CT.

## 2021-01-09 ENCOUNTER — Inpatient Hospital Stay (HOSPITAL_COMMUNITY)
Admit: 2021-01-09 | Discharge: 2021-01-09 | Disposition: A | Discharge status: Home / Self Care | Payer: Medicare HMO | Attending: Internal Medicine | Admitting: Internal Medicine

## 2021-01-09 ENCOUNTER — Inpatient Hospital Stay: Payer: Medicare HMO

## 2021-01-09 DIAGNOSIS — R55 Syncope and collapse: Secondary | ICD-10-CM | POA: Diagnosis not present

## 2021-01-09 DIAGNOSIS — I361 Nonrheumatic tricuspid (valve) insufficiency: Secondary | ICD-10-CM | POA: Diagnosis not present

## 2021-01-09 DIAGNOSIS — F331 Major depressive disorder, recurrent, moderate: Secondary | ICD-10-CM

## 2021-01-09 LAB — BASIC METABOLIC PANEL
Anion gap: 9 (ref 5–15)
BUN: <5 mg/dL — ABNORMAL LOW (ref 6–20)
CO2: 32 mmol/L (ref 22–32)
Calcium: 8.3 mg/dL — ABNORMAL LOW (ref 8.9–10.3)
Chloride: 97 mmol/L — ABNORMAL LOW (ref 98–111)
Creatinine, Ser: 0.71 mg/dL (ref 0.61–1.24)
GFR, Estimated: >60 mL/min (ref >60)
Glucose, Bld: 97 mg/dL (ref 70–99)
Potassium: 3.8 mmol/L (ref 3.5–5.1)
Sodium: 138 mmol/L (ref 135–145)

## 2021-01-09 LAB — CBC
HCT: 36.1 % — ABNORMAL LOW (ref 39.0–52.0)
Hemoglobin: 11.7 g/dL — ABNORMAL LOW (ref 13.0–17.0)
MCH: 29 pg (ref 26.0–34.0)
MCHC: 32.4 g/dL (ref 30.0–36.0)
MCV: 89.4 fL (ref 80.0–100.0)
Platelets: 341 10*3/uL (ref 150–400)
RBC: 4.04 MIL/uL — ABNORMAL LOW (ref 4.22–5.81)
RDW: 12.4 % (ref 11.5–15.5)
WBC: 9.4 10*3/uL (ref 4.0–10.5)
nRBC: 0 % (ref 0.0–0.2)

## 2021-01-09 LAB — ECHOCARDIOGRAM COMPLETE
AR max vel: 2.47 cm2
AV Peak grad: 5.6 mmHg
Ao pk vel: 1.18 m/s
Area-P 1/2: 4.86 cm2
Height: 69 in
S' Lateral: 3.36 cm
Weight: 1920 oz

## 2021-01-09 LAB — URINE DRUG SCREEN, QUALITATIVE (ARMC ONLY)
Amphetamines, Ur Screen: NOT DETECTED
Barbiturates, Ur Screen: NOT DETECTED
Benzodiazepine, Ur Scrn: NOT DETECTED
Cannabinoid 50 Ng, Ur ~~LOC~~: NOT DETECTED
Cocaine Metabolite,Ur ~~LOC~~: NOT DETECTED
MDMA (Ecstasy)Ur Screen: NOT DETECTED
Methadone Scn, Ur: NOT DETECTED
Opiate, Ur Screen: POSITIVE — AB
Phencyclidine (PCP) Ur S: NOT DETECTED
Tricyclic, Ur Screen: NOT DETECTED

## 2021-01-09 LAB — SEDIMENTATION RATE: Sed Rate: 34 mm/hr — ABNORMAL HIGH (ref 0–20)

## 2021-01-09 LAB — MRSA PCR SCREENING: MRSA by PCR: NEGATIVE

## 2021-01-09 LAB — FERRITIN: Ferritin: 187 ng/mL (ref 24–336)

## 2021-01-09 LAB — HIV ANTIBODY (ROUTINE TESTING W REFLEX): HIV Screen 4th Generation wRfx: NONREACTIVE

## 2021-01-09 MED ORDER — IPRATROPIUM BROMIDE 0.02 % IN SOLN
0.5000 mg | Freq: Four times a day (QID) | RESPIRATORY_TRACT | Status: DC
  Administered 2021-01-09 – 2021-01-10 (×2): 0.5 mg via RESPIRATORY_TRACT
  Filled 2021-01-09 (×2): qty 2.5

## 2021-01-09 MED ORDER — SODIUM CHLORIDE 0.9 % IV SOLN
100.0000 mg | Freq: Two times a day (BID) | INTRAVENOUS | Status: DC
  Administered 2021-01-09 – 2021-01-10 (×2): 100 mg via INTRAVENOUS
  Filled 2021-01-09 (×3): qty 100

## 2021-01-09 MED ORDER — LEVALBUTEROL HCL 0.63 MG/3ML IN NEBU
0.6300 mg | INHALATION_SOLUTION | Freq: Four times a day (QID) | RESPIRATORY_TRACT | Status: DC
  Administered 2021-01-09 – 2021-01-10 (×2): 0.63 mg via RESPIRATORY_TRACT
  Filled 2021-01-09 (×4): qty 3

## 2021-01-09 MED ORDER — GUAIFENESIN ER 600 MG PO TB12
1200.0000 mg | ORAL_TABLET | Freq: Two times a day (BID) | ORAL | Status: DC
  Administered 2021-01-09: 1200 mg via ORAL
  Filled 2021-01-09 (×2): qty 2

## 2021-01-09 MED ORDER — SODIUM CHLORIDE 0.9 % IV SOLN
1.0000 g | INTRAVENOUS | Status: DC
  Administered 2021-01-09: 1 g via INTRAVENOUS
  Filled 2021-01-09 (×2): qty 10

## 2021-01-09 MED ORDER — KETOROLAC TROMETHAMINE 30 MG/ML IJ SOLN
30.0000 mg | Freq: Once | INTRAMUSCULAR | Status: AC
  Administered 2021-01-09: 30 mg via INTRAVENOUS
  Filled 2021-01-09: qty 1

## 2021-01-09 NOTE — Progress Notes (Signed)
PROGRESS NOTE    [name]  AOZ:[id number] DOB: 12-07-1966 DOA: 01/08/2021 PCP: Lalla Brothers, MD   Brief Narrative:  HPI per Dr. Kennedy Bucker on 01/08/21 [name] is a 55 y.o. male with medical history significant for chronic central neuropathic pain, history of GERD, history of TBI, PTSD who presents to the ER for evaluation of worsening left hip pain.  He appears to have had a mechanical fall about a week ago and states that he slipped and fell landing on his left hip and denies any loss of consciousness.  He has had worsening pain in that hip since the fall and did not come to the emergency room to get checked out because he did not want to wait with patient acutely ill in the ER waiting room because he seen a physician. Patient sustained 2 more falls on the night prior to his admission and states that each time he felt like his legs gave way resulting in a fall and the second time he hit his head and blacked out.  He was not able to get up from his kitchen floor when he regained consciousness and states that he crawled to the living room and called EMS. He rates his left hip pain an 8 x 10 in intensity at its worst and pain is aggravated with any attempts at weightbearing. Imaging was done in the ER to rule out a fracture and is negative for an acute fracture. He denies having any fever, no chills, no chest pain, no shortness of breath, no cough, no nausea, no vomiting, no abdominal pain, no changes in his bowel habits, no dizziness, no lightheadedness, no diaphoresis, no palpitations, no orthopnea, no urinary symptoms. Labs show sodium 139, potassium 4.1, chloride 98, bicarb 27, glucose 82, BUN 7, creatinine 0.87, calcium 8.1, lactic acid 1.1, procalcitonin 0.10, white count 8.1, hemoglobin 10.8, hematocrit 33.9, MCV 90.6, RDW 12.4, platelet count 251 SARS coronavirus 2 PCR test is negative Chest x-ray reviewed by me shows patchy infiltrates in the upper and mid lungs  bilaterally likely representing multifocal pneumonia. X-ray of the left hip shows no acute bony abnormalities. Postoperative internal fixation of old healed subcapital left femoral neck fracture. CT scan of the head and cervical spine shows no acute intracranial abnormalities.  Nonspecific straightening of usual cervical lordosis.  No acute displaced fractures identified in the cervical spine.  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  Patchy nodular infiltrates in the lung apices suggesting multifocal pneumonia. CT scan of the chest without contrast shows patchy groundglass and consolidative opacities within the lower lobes bilaterally as well as large areas of consolidation predominantly within the right upper lobe however also visualized within the lingula, left upper lobe and right middle lobes.  Findings are concerning for multifocal pneumonia.  Follow-up chest CT is recommended in 3 to 4 weeks following trial of antibiotic therapy to ensure resolution and exclude underlying malignancy.  Mildly enlarged right paratracheal lymph nodes, likely reactive. CT scan of left hip without contrast shows no acute finding. Remote and healed left femoral neck fracture. Twelve-lead EKG reviewed by me shows normal sinus rhythm    ED Course: Patient is a 55 year old male who presents to the ER for evaluation of left hip pain following a fall with inability to bear weight on his left lower extremity.  While in the ER patient was noted to be hypoxic with room air pulse oximetry of 85% and is currently on 2 L of oxygen with improvement  in his pulse oximetry.  Imaging shows patchy nodular infiltrates in the lung apices concerning for multifocal pneumonia.  Patient has no pulmonary symptoms and his COVID-19 PCR test is negative.  He received empiric IV antibiotic therapy in the emergency room and will be admitted to the hospital for further evaluation.  **Interim History Seen and examined at bedside and is  working with therapy and still a little hypoxic.  PT recommending SNF.  Patient still feels unsteady on his feet and states his legs keep giving out.  Pulmonary consulted for further evaluation and they are going to perform infectious work-up for his pneumonia.  Assessment & Plan:   Principal Problem:   Left hip pain Active Problems:   Major depressive disorder, recurrent episode, moderate (HCC)   Acute respiratory failure with hypoxia (HCC)   Syncope and collapse   Fall  Status post fall with worsening left hip pain -Patient is status post left hip arthroplasty and presents to the ER for evaluation of left hip pain following multiple falls at home -He has had a left hip x-ray as well as a CT scan which were both negative for fracture or patient is unable to bear weight on the left lower extremity -MRI of the pelvis has been ordered to rule out an occult fracture "No acute osseous abnormality of the LEFT hip. 2. Intramuscular edema and a small amount of intramuscular fluid signal adjacent to the RIGHT hip within the RIGHT gluteus medius, tensor fascia lata, and vastus lateralis muscles. There is also a small focus of intramuscular edema within the LEFT gluteus maximus muscle overlying the LEFT greater trochanter. Findings are favored to reflect muscle strains. 3. No organized soft tissue fluid collection or hematoma"  -We will request PT evaluation and treatment if MRI is negative for an occult fracture -Pain control as below -Place patient on fall precautions  Syncope -Patient states that he fell and hit his head with associated loss of consciousness -CT Head and Cervical Spine w/o Contrast done and showed "No acute intracranial abnormalities. 2. Nonspecific straightening of usual cervical lordosis. No acute displaced fractures identified in the cervical spine. 3. 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 4. Patchy nodular infiltrates in the lung apices suggesting multifocal pneumonia."   -Syncope may have been traumatic but will place patient on a cardiac monitor to rule out arrhythmias -Check Orthostatic VS -PT/OT to evaluate and Treat and they are recommending SNF -UDS showed + Opiates -Obtain 2D echocardiogram to assess LVEF; ECHO done showed "Left ventricular ejection fraction, by estimation, is 50 to 55%. The left ventricle has low normal function. The left ventricle has no regional  wall motion abnormalities. Left ventricular diastolic parameters were normal. 2. Right ventricular systolic function is normal. The right ventricular  size is normal. 3. The mitral valve is normal in structure. Trivial mitral valve  regurgitation. 4. The aortic valve is tricuspid. Aortic valve regurgitation is not  visualized. No aortic stenosis is present."   -He had an Elevated D-Dimer of 665.69 and may need a CTA to r/o PE but will first get LE Venous Duplex;   Normocytic Anemia -Patient's Hgb/Hct went from 10.8/33.9 -> 11.7/36.1 -Check Anemia Panel in the AM -Continue to Monitor for S/Sx of Bleeding -Repeat CBC in the AM   Major Depressive Disorder and Anxiety -Continue Fluoxetine 60 mg po Daily  -C/w Diazepam 2 mg po qHS  GERD -C/w Pantoprazole 40 mg po Daily   Chronic Pain Syndrome -Continue scheduled opioids with 15 mg po 4  Times Daily and monitor respiratory status closely -C/w Fentanyl Patch 62 mcg/hr  Acute Respiratory Failure with Hypoxia in the setting of Multifocal PNA -Unknown organism causing the Multifocal PNA -Patient noted to have room air pulse oximetry of 85% and is currently on 2 L of oxygen with improvement in his pulse oximetry to 92% -CT of the Chest done and showed "Patchy ground-glass and consolidative opacities within the lower lobes bilaterally as well as larger areas of consolidation predominately within the right upper lobe however also visualized within the lingula, left upper lobe and right middle lobes. Findings are concerning for multifocal  pneumonia." Imaging shows bilateral infiltrates but patient denies having any respiratory symptoms and does not feel SOB. -COVID-19 PCR test is negative. He received 2 doses of the Moderna vaccine -He denies having any cough, no shortness of breath, no fever, no chills -??  Aspiration Pneumonia -SARS-CoV-2 Pending  -We will keep patient n.p.o. on request swallow function evaluation by speech therapy -Continue oxygen supplementation at 2 L  -SpO2: 95 % O2 Flow Rate (L/min): 2 L/min  -Blood Cx x2  -Continuous pulse oximetry and maintain O2 saturation greater 90% -CRP 3.5 and ESR is 34 -PCT owas <0.10 -Will start CAP Coverage with Ceftriaxone and Doxycycline  -Start Xopenex/Atrovent, add Flutter Valve, Incentive Spirometry and Guaifenesin 1200 mg po BID -Check Ambulatory Home O2 Screen prior to D/C  -Pulmonary consulted for further evaluation and Recommendations and they are going to perform infectious work-up for his pneumonia and recommending serum Fungitell, Legionella antibody, strep pneumonia urine antigen, histoplasma urine antigen performed an ANA comprehensive panel as well as an ESR and CRP as well as AFB sputum cytology   DVT prophylaxis: SCDs Code Status: FULL CODE  Family Communication: No family present at beside  Disposition Plan:   Status is: Inpatient  Remains inpatient appropriate because:Unsafe d/c plan, IV treatments appropriate due to intensity of illness or inability to take PO and Inpatient level of care appropriate due to severity of illness   Dispo: The patient is from: Home              Anticipated d/c is to: Home              Anticipated d/c date is: 2 days              Patient currently is not medically stable to d/c.   Difficult to place patient Yes  Consultants:   Pulmonary   Procedures:  ECHOCARDIOGRAM IMPRESSIONS    1. Left ventricular ejection fraction, by estimation, is 50 to 55%. The  left ventricle has low normal function. The left  ventricle has no regional  wall motion abnormalities. Left ventricular diastolic parameters were  normal.  2. Right ventricular systolic function is normal. The right ventricular  size is normal.  3. The mitral valve is normal in structure. Trivial mitral valve  regurgitation.  4. The aortic valve is tricuspid. Aortic valve regurgitation is not  visualized. No aortic stenosis is present.   FINDINGS  Left Ventricle: Left ventricular ejection fraction, by estimation, is 50  to 55%. The left ventricle has low normal function. The left ventricle has  no regional wall motion abnormalities. The left ventricular internal  cavity size was normal in size.  There is no left ventricular hypertrophy. Left ventricular diastolic  parameters were normal.   Right Ventricle: Pulmonary artery pressure is normal (~20 mmHg plus  central venous pressure). The right ventricular size is normal. No  increase in  right ventricular wall thickness. Right ventricular systolic  function is normal.   Left Atrium: Left atrial size was normal in size.   Right Atrium: Right atrial size was normal in size.   Pericardium: The pericardium was not well visualized.   Mitral Valve: The mitral valve is normal in structure. Trivial mitral  valve regurgitation.   Tricuspid Valve: The tricuspid valve is normal in structure. Tricuspid  valve regurgitation is mild.   Aortic Valve: The aortic valve is tricuspid. Aortic valve regurgitation is  not visualized. No aortic stenosis is present. Aortic valve peak gradient  measures 5.6 mmHg.   Pulmonic Valve: The pulmonic valve was normal in structure. Pulmonic valve  regurgitation is trivial. No evidence of pulmonic stenosis.   Aorta: The aortic root is normal in size and structure.   Pulmonary Artery: The pulmonary artery is not well seen.   Venous: The inferior vena cava was not well visualized.   IAS/Shunts: The interatrial septum was not well visualized.      LEFT VENTRICLE  PLAX 2D  LVIDd:     4.72 cm Diastology  LVIDs:     3.36 cm LV e' medial:  9.68 cm/s  LV PW:     0.92 cm LV E/e' medial: 9.3  LV IVS:    0.87 cm LV e' lateral:  12.80 cm/s  LVOT diam:   1.90 cm LV E/e' lateral: 7.0  LVOT Area:   2.84 cm     RIGHT VENTRICLE  RV Mid diam:  3.32 cm  RV S prime:   14.50 cm/s  TAPSE (M-mode): 2.4 cm   LEFT ATRIUM       Index    RIGHT ATRIUM      Index  LA diam:    3.50 cm 2.10 cm/m RA Area:   14.70 cm  LA Vol (A2C):  54.8 ml 32.96 ml/m RA Volume:  34.50 ml 20.75 ml/m  LA Vol (A4C):  35.1 ml 21.11 ml/m  LA Biplane Vol: 44.3 ml 26.64 ml/m  AORTIC VALVE        PULMONIC VALVE  AV Area (Vmax): 2.47 cm  PV Vmax:    1.05 m/s  AV Vmax:    118.00 cm/s PV Peak grad:  4.4 mmHg  AV Peak Grad:  5.6 mmHg  RVOT Peak grad: 3 mmHg  LVOT Vmax:   103.00 cm/s    AORTA  Ao Root diam: 2.60 cm   MITRAL VALVE        TRICUSPID VALVE  MV Area (PHT): 4.86 cm  TR Peak grad:  20.1 mmHg  MV Decel Time: 156 msec  TR Vmax:    224.00 cm/s  MV E velocity: 90.00 cm/s  MV A velocity: 73.30 cm/s SHUNTS  MV E/A ratio: 1.23    Systemic Diam: 1.90 cm  MV A Prime:  9.8 cm/s   Antimicrobials:  Anti-infectives (From admission, onward)   Start     Dose/Rate Route Frequency Ordered Stop   01/08/21 0815  cefTRIAXone (ROCEPHIN) 1 g in sodium chloride 0.9 % 100 mL IVPB        1 g 200 mL/hr over 30 Minutes Intravenous  Once 01/08/21 0809 01/08/21 1056   01/08/21 0815  doxycycline (VIBRA-TABS) tablet 100 mg        100 mg Oral  Once 01/08/21 0809 01/08/21 0916        Subjective: Seen and examined at bedside and he was not very short of breath but still hypoxic.  Working with therapy and  states that his legs buckle.  Denies any chest pain or lightheadedness.  States he has passed out multiple times last few weeks.  No nausea or vomiting.  No other concerns  about this time.  Objective: Vitals:   01/08/21 2025 01/08/21 2358 01/08/21 2358 01/09/21 0557  BP: 115/79 101/68 101/68 103/68  Pulse: 91 83 79 77  Resp: $Remo'17 17 17 17  'OAAeI$ Temp: 98.1 F (36.7 C) 98 F (36.7 C) 98 F (36.7 C) 97.7 F (36.5 C)  TempSrc:      SpO2: 94% 97% 96% 94%  Weight:      Height:        Intake/Output Summary (Last 24 hours) at 01/09/2021 0160 Last data filed at 01/09/2021 0000 Gross per 24 hour  Intake 240 ml  Output 1550 ml  Net -1310 ml   Filed Weights   01/08/21 0021  Weight: 54.4 kg   Examination: Physical Exam:  Constitutional: WN/WD thin Caucasian male currently in no acute distress appears slightly agitated  Eyes: PERRL, lids and conjunctivae normal, sclerae anicteric  ENMT: External Ears, Nose appear normal. Grossly normal hearing.  Neck: Appears normal, supple, no cervical masses, normal ROM, no appreciable thyromegaly; no JVD Respiratory: Diminished to auscultation bilaterally with coarse breath sounds, no wheezing, rales, rhonchi or crackles. Normal respiratory effort and patient is not tachypenic. No accessory muscle use. Unlabored breathing but still wearing supplemental O2 via [place] Cardiovascular: RRR, no murmurs / rubs / gallops. S1 and S2 auscultated. No appreciable LE edema Abdomen: Soft, non-tender, non-distended. Bowel sounds positive.  GU: Deferred. Musculoskeletal: No clubbing / cyanosis of digits/nails. No joint deformity upper and lower extremities Skin: No rashes, lesions, ulcers on a limited skin evaluation. No induration; Warm and dry.  Neurologic: CN 2-12 grossly intact with no focal deficits.  Romberg sign and cerebellar reflexes not assessed.  Psychiatric: Normal judgment and insight. Alert and oriented x 3. Slightly frustrated mood and appropriate affect.   Data Reviewed: I have personally reviewed following labs and imaging studies  CBC: Recent Labs  Lab 01/08/21 0044 01/09/21 0422  WBC 8.1 9.4  HGB 10.8* 11.7*  HCT  33.9* 36.1*  MCV 90.6 89.4  PLT 251 109   Basic Metabolic Panel: Recent Labs  Lab 01/08/21 0044 01/09/21 0422  NA 139 138  K 4.1 3.8  CL 98 97*  CO2 27 32  GLUCOSE 82 97  BUN 7 <5*  CREATININE 0.87 0.71  CALCIUM 8.1* 8.3*   GFR: Estimated Creatinine Clearance: 81.2 mL/min (by C-G formula based on SCr of 0.71 mg/dL). Liver Function Tests: No results for input(s): AST, ALT, ALKPHOS, BILITOT, PROT, ALBUMIN in the last 168 hours. No results for input(s): LIPASE, AMYLASE in the last 168 hours. No results for input(s): AMMONIA in the last 168 hours. Coagulation Profile: No results for input(s): INR, PROTIME in the last 168 hours. Cardiac Enzymes: No results for input(s): CKTOTAL, CKMB, CKMBINDEX, TROPONINI in the last 168 hours. BNP (last 3 results) No results for input(s): PROBNP in the last 8760 hours. HbA1C: No results for input(s): HGBA1C in the last 72 hours. CBG: No results for input(s): GLUCAP in the last 168 hours. Lipid Profile: No results for input(s): CHOL, HDL, LDLCALC, TRIG, CHOLHDL, LDLDIRECT in the last 72 hours. Thyroid Function Tests: No results for input(s): TSH, T4TOTAL, FREET4, T3FREE, THYROIDAB in the last 72 hours. Anemia Panel: Recent Labs    01/08/21 0903  FERRITIN 200   Sepsis Labs: Recent Labs  Lab 01/08/21 0049 01/08/21 3235  01/08/21 0903  PROCALCITON <0.10  --   --   LATICACIDVEN  --  1.1 0.9    Recent Results (from the past 240 hour(s))  SARS Coronavirus 2 by RT PCR (hospital order, performed in [facility] hospital lab) Nasopharyngeal Nasopharyngeal Swab     Status: None   Collection Time: 01/08/21  4:53 AM   Specimen: Nasopharyngeal Swab  Result Value Ref Range Status   SARS Coronavirus 2 NEGATIVE NEGATIVE Final    Comment: (NOTE) SARS-CoV-2 target nucleic acids are NOT DETECTED.  The SARS-CoV-2 RNA is generally detectable in upper and lower respiratory specimens during the acute phase of infection. The lowest concentration of  SARS-CoV-2 viral copies this assay can detect is 250 copies / mL. A negative result does not preclude SARS-CoV-2 infection and should not be used as the sole basis for treatment or other patient management decisions.  A negative result may occur with improper specimen collection / handling, submission of specimen other than nasopharyngeal swab, presence of viral mutation(s) within the areas targeted by this assay, and inadequate number of viral copies (<250 copies / mL). A negative result must be combined with clinical observations, patient history, and epidemiological information.  Fact Sheet for Patients:   StrictlyIdeas.no  Fact Sheet for Healthcare Providers: BankingDealers.co.za  This test is not yet approved or  cleared by the [place] FDA and has been authorized for detection and/or diagnosis of SARS-CoV-2 by FDA under an Emergency Use Authorization (EUA).  This EUA will remain in effect (meaning this test can be used) for the duration of the COVID-19 declaration under Section 564(b)(1) of the Act, 21 U.S.C. section 360bbb-3(b)(1), unless the authorization is terminated or revoked sooner.  Performed at [facility], [address] [place], [place] [address]      RN Pressure Injury Documentation:     Estimated body mass index is 17.72 kg/m as calculated from the following:   Height as of this encounter: $RemoveBeforeD'5\' 9"'[id number]$  (1.753 m).   Weight as of this encounter: 54.4 kg.  Malnutrition Type:   Malnutrition Characteristics:   Nutrition Interventions:    Radiology Studies: DG Chest 1 View  Result Date: 01/08/2021 CLINICAL DATA:  Two falls today. Evidence of multifocal pneumonia on CT cervical spine. EXAM: CHEST  1 VIEW COMPARISON:  04/07/2020 FINDINGS: Cardiac enlargement. Patchy infiltrates demonstrated in the upper and mid lungs bilaterally likely representing multifocal pneumonia. No pleural effusions. No  pneumothorax. Mediastinal contours appear intact. Postoperative changes in the lower cervical spine and left shoulder. IMPRESSION: Patchy infiltrates in the upper and mid lungs bilaterally likely representing multifocal pneumonia. Electronically Signed   By: Lucienne Capers M.D.   On: 01/08/2021 01:43   CT Head Wo Contrast  Result Date: 01/08/2021 CLINICAL DATA:  Two falls today with loss of consciousness on the second fall, striking head. EXAM: CT HEAD WITHOUT CONTRAST CT CERVICAL SPINE WITHOUT CONTRAST TECHNIQUE: Multidetector CT imaging of the head and cervical spine was performed following the standard protocol without intravenous contrast. Multiplanar CT image reconstructions of the cervical spine were also generated. COMPARISON:  MRI cervical spine 10/16/2019.  CT head 10/15/2019 FINDINGS: CT HEAD FINDINGS Brain: No evidence of acute infarction, hemorrhage, hydrocephalus, extra-axial collection or mass lesion/mass effect. Vascular: No hyperdense vessel or unexpected calcification. Skull: Normal. Negative for fracture or focal lesion. Sinuses/Orbits: No acute finding. Other: None. CT CERVICAL SPINE FINDINGS Alignment: Straightening of the usual cervical lordosis is likely positional but muscle spasm could also have this appearance. No anterior subluxation.  Normal alignment of the facet joints. C1-2 articulation appears intact. Skull base and vertebrae: Skull base appears intact. No vertebral compression deformities. No focal bone lesion or bone destruction. Soft tissues and spinal canal: No prevertebral soft tissue swelling. No abnormal paraspinal soft tissue mass or infiltration. 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 Mild degenerative changes at C4-5 and C6-7 levels. Upper chest: Patchy nodular infiltrates are suggested in the lung apices suggesting multifocal pneumonia. Other: None. IMPRESSION: 1. No acute intracranial abnormalities. 2. Nonspecific straightening of usual cervical lordosis.  No acute displaced fractures identified in the cervical spine. 3. 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 4. Patchy nodular infiltrates in the lung apices suggesting multifocal pneumonia. Electronically Signed   By: Lucienne Capers M.D.   On: 01/08/2021 01:04   CT Chest Wo Contrast  Result Date: 01/08/2021 CLINICAL DATA:  Decreased oxygen saturation. EXAM: CT CHEST WITHOUT CONTRAST TECHNIQUE: Multidetector CT imaging of the chest was performed following the standard protocol without IV contrast. COMPARISON:  Chest radiograph earlier same day. FINDINGS: Cardiovascular: Mildly enlarged heart size. Trace fluid superior pericardial recess. Coronary arterial vascular calcifications. Mediastinum/Nodes: No axillary lymphadenopathy. 1.1 cm right paratracheal lymph node (image 35; series 2). Limited evaluation of the hilar structures due to lack of contrast. Normal appearance of the esophagus. Lungs/Pleura: Central airways are patent. Patchy ground-glass and consolidative opacities within the lower lobes bilaterally. Additionally there are larger areas of consolidation predominately within the right upper lobe however also visualized within the lingula, left upper lobe and right middle lobes. No large pleural effusion or pneumothorax. Upper Abdomen: No acute process. Musculoskeletal: No aggressive or acute appearing osseous lesions. IMPRESSION: 1. Patchy ground-glass and consolidative opacities within the lower lobes bilaterally as well as larger areas of consolidation predominately within the right upper lobe however also visualized within the lingula, left upper lobe and right middle lobes. Findings are concerning for multifocal pneumonia. Followup chest CT is recommended in 3-4 weeks following trial of antibiotic therapy to ensure resolution and exclude underlying malignancy. 2. Mildly enlarged right paratracheal lymph node, likely reactive in etiology. 3. Aortic atherosclerosis. Aortic Atherosclerosis (ICD10-I70.0).  Electronically Signed   By: Lovey Newcomer M.D.   On: 01/08/2021 07:54   CT Cervical Spine Wo Contrast  Result Date: 01/08/2021 CLINICAL DATA:  Two falls today with loss of consciousness on the second fall, striking head. EXAM: CT HEAD WITHOUT CONTRAST CT CERVICAL SPINE WITHOUT CONTRAST TECHNIQUE: Multidetector CT imaging of the head and cervical spine was performed following the standard protocol without intravenous contrast. Multiplanar CT image reconstructions of the cervical spine were also generated. COMPARISON:  MRI cervical spine 10/16/2019.  CT head 10/15/2019 FINDINGS: CT HEAD FINDINGS Brain: No evidence of acute infarction, hemorrhage, hydrocephalus, extra-axial collection or mass lesion/mass effect. Vascular: No hyperdense vessel or unexpected calcification. Skull: Normal. Negative for fracture or focal lesion. Sinuses/Orbits: No acute finding. Other: None. CT CERVICAL SPINE FINDINGS Alignment: Straightening of the usual cervical lordosis is likely positional but muscle spasm could also have this appearance. No anterior subluxation. Normal alignment of the facet joints. C1-2 articulation appears intact. Skull base and vertebrae: Skull base appears intact. No vertebral compression deformities. No focal bone lesion or bone destruction. Soft tissues and spinal canal: No prevertebral soft tissue swelling. No abnormal paraspinal soft tissue mass or infiltration. 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 Mild degenerative changes at C4-5 and C6-7 levels. Upper chest: Patchy nodular infiltrates are suggested in the lung apices suggesting multifocal pneumonia. Other: None. IMPRESSION: 1. No acute  intracranial abnormalities. 2. Nonspecific straightening of usual cervical lordosis. No acute displaced fractures identified in the cervical spine. 3. 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 4. Patchy nodular infiltrates in the lung apices suggesting multifocal pneumonia. Electronically Signed   By: Lucienne Capers M.D.   On: 01/08/2021 01:04   CT Hip Left Wo Contrast  Result Date: 01/08/2021 CLINICAL DATA:  Hip trauma with fracture suspected. EXAM: CT OF THE LEFT HIP WITHOUT CONTRAST TECHNIQUE: Multidetector CT imaging of the left hip was performed according to the standard protocol. Multiplanar CT image reconstructions were also generated. COMPARISON:  Radiography from earlier today FINDINGS: Bones/Joint/Cartilage Remote left femoral neck fracture with fixation using 3 cannulated screws. The fracture is healed. Remote right inferior pubic ramus fracture. No acute fracture or hip dislocation. Negative for hip joint effusion. Ligaments Suboptimally assessed by CT. Muscles and Tendons Musculotendinous no visible swelling or disruption. Soft tissues No visible injury. IMPRESSION: 1. No acute finding. 2. Remote and healed left femoral neck fracture. Electronically Signed   By: Monte Fantasia M.D.   On: 01/08/2021 07:58   MR HIP LEFT WO CONTRAST  Result Date: 01/08/2021 CLINICAL DATA:  Left hip pain after fall EXAM: MR OF THE LEFT HIP WITHOUT CONTRAST TECHNIQUE: Multiplanar, multisequence MR imaging was performed. No intravenous contrast was administered. COMPARISON:  X-ray and CT dated 01/08/2021 FINDINGS: Bones: Patient is status post ORIF of a left femoral neck fracture via 3 cannulated screws. Mild associated susceptibility artifact. No acute fracture. No dislocation. No evidence of femoral head avascular necrosis. No bone marrow edema. No suspicious marrow replacing lesion. No significant arthropathy of the SI joints or right hip. Articular cartilage and labrum Articular cartilage: Evaluation degraded by susceptibility artifact. No large cartilage defects identified within this limitation. No subchondral marrow signal changes. Labrum:  Poorly evaluated. Joint or bursal effusion Joint effusion:  No hip joint effusion. Bursae: No bursal fluid collection. Muscles and tendons Muscles and tendons: There is  intramuscular edema and a small amount of intramuscular fluid signal adjacent to the right hip within the right gluteus medius, tensor fascia lata, and vastus lateralis muscles. Small focus of intramuscular edema within the left gluteus maximus muscle overlying the left greater trochanter. The gluteal, hamstring, iliopsoas, rectus femoris, and adductor tendons appear intact without tear or significant tendinosis. No muscle atrophy or fatty infiltration. Other findings Miscellaneous: No organized soft tissue fluid collection or hematoma. No inguinal lymphadenopathy. IMPRESSION: 1. No acute osseous abnormality of the LEFT hip. 2. Intramuscular edema and a small amount of intramuscular fluid signal adjacent to the RIGHT hip within the RIGHT gluteus medius, tensor fascia lata, and vastus lateralis muscles. There is also a small focus of intramuscular edema within the LEFT gluteus maximus muscle overlying the LEFT greater trochanter. Findings are favored to reflect muscle strains. 3. No organized soft tissue fluid collection or hematoma. Electronically Signed   By: Davina Poke D.O.   On: 01/08/2021 13:11   DG HIP UNILAT WITH PELVIS 2-3 VIEWS LEFT  Result Date: 01/08/2021 CLINICAL DATA:  Left hip pain after a fall EXAM: DG HIP (WITH OR WITHOUT PELVIS) 2-3V LEFT COMPARISON:  05/03/2020 FINDINGS: Previous internal fixation of the left proximal femur with 3 screws. No change in appearance of the hardware. Deformity of the subcapital left femur consistent with old healed fracture. No evidence of acute fracture or dislocation. Pelvis appears intact. SI joints and symphysis pubis are not displaced. Soft tissues are unremarkable. IMPRESSION: No acute bony abnormalities. Postoperative internal fixation of old  healed subcapital left femoral neck fracture. Electronically Signed   By: Lucienne Capers M.D.   On: 01/08/2021 01:52   Scheduled Meds: . diazepam  2 mg Oral QHS  . fentaNYL  1 patch Transdermal Q72H  .  fentaNYL  1 patch Transdermal Q72H  . FLUoxetine  60 mg Oral Daily  . metoprolol tartrate  12.5 mg Oral Daily  . oxyCODONE  15 mg Oral QID  . pantoprazole  40 mg Oral Daily  . cyanocobalamin  1,000 mcg Oral Daily   Continuous Infusions:   LOS: 1 day   Kerney Elbe, DO [facility] PAGER is on [place]  If 7PM-7AM, please contact night-coverage [email or web address]

## 2021-01-09 NOTE — Progress Notes (Signed)
*  PRELIMINARY RESULTS* Echocardiogram 2D Echocardiogram has been performed.  [name] [name] 01/09/2021, 12:38 PM

## 2021-01-09 NOTE — Progress Notes (Signed)
Pt rested well at HS although he continues to complain of 9-10/10 headache pain despite pain medication.  At 0225, on call provider notified of pt's continued complaint of headache.  One time dose of Toradol administered at 0330.  Pt reports that medication was not effective.  He also states that the only medication that has worked for him and his headaches is morphine.

## 2021-01-09 NOTE — Evaluation (Signed)
Physical Therapy Evaluation Patient Details Name: [name] MRN: [id number] DOB: 07/23/1966 Today's Date: 01/09/2021   History of Present Illness  [name] is a 55 y.o. male with medical history significant for chronic central neuropathic pain, history of GERD, history of TBI, PTSD who presents to the ER for evaluation of worsening left hip pain following fall. Imaging done and found no acute findings. While in the ER patient was noted to be hypoxic with room air pulse oximetry of 85% and is currently on 2 L of oxygen with improvement in his pulse oximetry.  Clinical Impression  The pt presents this session with willingness to work with PT as he is hoping to d/c home soon. The pt presents with L sided weakness and generalized balance deficits. He reports bilateral neuropathy that extents past his elbows in his UE and up this thigh in his LE. During gait trials the pt demonstrates improved gait and balance with the use of a RW. No knee buckling or LOB was noted this session. At this time he would benefit from STR at SNF d/t lack of family support. PT will continue to follow while admitted to optimize functional mobility.     Follow Up Recommendations SNF    Equipment Recommendations  Rolling walker with 5" wheels    Recommendations for Other Services       Precautions / Restrictions Precautions Precautions: Fall Restrictions Weight Bearing Restrictions: No      Mobility  Bed Mobility Overal bed mobility: Modified Independent             General bed mobility comments: Pt able to sit at EOB without use of bed railings, however HOB elevated.    Transfers Overall transfer level: Needs assistance Equipment used: Rolling walker (2 wheeled) Transfers: Sit to/from Stand Sit to Stand: Supervision            Ambulation/Gait Ambulation/Gait assistance: Supervision;Min assist Gait Distance (Feet): 60 Feet (30' with RW, 30' without AD) Assistive device: Rolling  walker (2 wheeled);None       General Gait Details: Pt demonstrating furniture surfing and Min A need with gait without an AD. Antalgic gait pattern noted with decreased weight shift to the L and decreased WB on the L for ambulation without an AD. Gait pattern presented more normalized with the use of an AD.  Stairs            Wheelchair Mobility    Modified Rankin (Stroke Patients Only)       Balance Overall balance assessment: Mild deficits observed, not formally tested                                           Pertinent Vitals/Pain Pain Assessment: 0-10 Pain Score: 9  Pain Location: posterior aspect of head, in location in which pt reports he hit his head during his fall. Pain Descriptors / Indicators: Discomfort;Sore Pain Intervention(s): Monitored during session    Home Living Family/patient expects to be discharged to:: Private residence Living Arrangements: Alone Available Help at Discharge: Family;Available PRN/intermittently Type of Home: House Home Access: Stairs to enter Entrance Stairs-Rails: None Entrance Stairs-Number of Steps: 5 Home Layout: One level Home Equipment: Walker - 2 wheels;Cane - quad Additional Comments: Pt reports that he has put his equipment away but does not specify if he has given it away.    Prior Function Level of  Independence: Independent with assistive device(s)         Comments: Pt reports he was independent without AD with household ambulation. He was driving and using cane for iADLs and community ambulation. He reports need for shopping cart for balance in grocery stores.     Hand Dominance   Dominant Hand: Right    Extremity/Trunk Assessment   Upper Extremity Assessment Upper Extremity Assessment: LUE deficits/detail LUE Deficits / Details: Pt with limited active shoulder flexion. 2/5 shoulder flexion strength, 4/5 elbow flexion, 3/5 grip strength. LUE Sensation: history of peripheral neuropathy     Lower Extremity Assessment Lower Extremity Assessment: LLE deficits/detail LLE Deficits / Details: 3+/5 hip flexion, 4/5 hip adductor/abductors, 3/5 knee extensors, 5/5 knee flexors; all movement done functionally and not is specific test positions. LLE Sensation: history of peripheral neuropathy    Cervical / Trunk Assessment Cervical / Trunk Assessment: Normal  Communication   Communication: No difficulties  Cognition Arousal/Alertness: Awake/alert Behavior During Therapy: WFL for tasks assessed/performed Overall Cognitive Status: Within Functional Limits for tasks assessed                                        General Comments      Exercises     Assessment/Plan    PT Assessment Patient needs continued PT services  PT Problem List Decreased strength;Decreased mobility;Decreased range of motion;Decreased activity tolerance;Decreased balance;Impaired sensation       PT Treatment Interventions Therapeutic exercise;Gait training;Stair training;Balance training;Functional mobility training;Therapeutic activities    PT Goals (Current goals can be found in the Care Plan section)  Acute Rehab PT Goals Patient Stated Goal: to return home PT Goal Formulation: With patient Time For Goal Achievement: 01/23/21 Potential to Achieve Goals: Good    Frequency Min 2X/week   Barriers to discharge Decreased caregiver support      Co-evaluation               AM-PAC PT "6 Clicks" Mobility  Outcome Measure Help needed turning from your back to your side while in a flat bed without using bedrails?: None Help needed moving from lying on your back to sitting on the side of a flat bed without using bedrails?: None Help needed moving to and from a bed to a chair (including a wheelchair)?: A Little Help needed standing up from a chair using your arms (e.g., wheelchair or bedside chair)?: A Little Help needed to walk in hospital room?: A Little Help needed climbing  3-5 steps with a railing? : A Lot 6 Click Score: 19    End of Session Equipment Utilized During Treatment: Gait belt Activity Tolerance: Patient tolerated treatment well Patient left: in chair;with nursing/sitter in room;with call bell/phone within reach Nurse Communication: Mobility status;Patient requests pain meds PT Visit Diagnosis: Unsteadiness on feet (R26.81);Muscle weakness (generalized) (M62.81);Difficulty in walking, not elsewhere classified (R26.2);History of falling (Z91.81)    Time: 5732-2025 PT Time Calculation (min) (ACUTE ONLY): 34 min   Charges:   PT Evaluation $PT Eval Low Complexity: 1 Low PT Treatments $Gait Training: 23-37 mins       10:49 AM, 01/09/21 Denece Shearer A. Mordecai Maes PT, DPT Physical Therapist - [facility] A Ashvin Adelson 01/09/2021, 10:49 AM

## 2021-01-09 NOTE — Consult Note (Signed)
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 intact. SI joints and symphysis pubis are not displaced. Soft tissues are unremarkable. IMPRESSION: No acute  bony abnormalities. Postoperative internal fixation of old healed subcapital left femoral neck fracture. Electronically Signed   By: Lucienne Capers M.D.   On: 01/08/2021 01:52      ASSESSMENT/PLAN     Acute hypoxemic respiratory failure - [name] has multifocal pneumonia with bilateral infiltrate present on admission  - COVIDnegative x 2  - [name] is now off supplemental O2 during my evaluation and was normoxic - will perform infectious workup for pneumonia -serum fungitell -legionella ab -strep pneumonia ur AG -Histoplasma Ur Ag -sputum resp cultures -AFB sputum expectorated specimen -sputum cytology  - [name] has history of RA - will obtain rheumatoid factor for possible acute exacerbation of underlying ILD - ESR -ANA comprehensive panel  -PT/OT for d/c planning     Thank you for allowing me to participate in the care of this [name].   [name]/Family are satisfied with care plan and all questions have been answered.  This document was prepared using Dragon voice recognition software and may  include unintentional dictation errors.     Ottie Glazier, M.D.  Division of [facility]

## 2021-01-10 ENCOUNTER — Inpatient Hospital Stay: Payer: Medicare HMO

## 2021-01-10 ENCOUNTER — Ambulatory Visit: Payer: Self-pay

## 2021-01-10 DIAGNOSIS — R918 Other nonspecific abnormal finding of lung field: Secondary | ICD-10-CM | POA: Diagnosis present

## 2021-01-10 DIAGNOSIS — R636 Underweight: Secondary | ICD-10-CM

## 2021-01-10 LAB — PHOSPHORUS: Phosphorus: 2.8 mg/dL (ref 2.5–4.6)

## 2021-01-10 LAB — CBC WITH DIFFERENTIAL/PLATELET
Abs Immature Granulocytes: 0.03 10*3/uL (ref 0.00–0.07)
Basophils Absolute: 0 10*3/uL (ref 0.0–0.1)
Basophils Relative: 0 %
Eosinophils Absolute: 0.1 10*3/uL (ref 0.0–0.5)
Eosinophils Relative: 1 %
HCT: 37.9 % — ABNORMAL LOW (ref 39.0–52.0)
Hemoglobin: 12.6 g/dL — ABNORMAL LOW (ref 13.0–17.0)
Immature Granulocytes: 0 %
Lymphocytes Relative: 13 %
Lymphs Abs: 1.2 10*3/uL (ref 0.7–4.0)
MCH: 29 pg (ref 26.0–34.0)
MCHC: 33.2 g/dL (ref 30.0–36.0)
MCV: 87.1 fL (ref 80.0–100.0)
Monocytes Absolute: 0.5 10*3/uL (ref 0.1–1.0)
Monocytes Relative: 5 %
Neutro Abs: 7.8 10*3/uL — ABNORMAL HIGH (ref 1.7–7.7)
Neutrophils Relative %: 81 %
Platelets: 364 10*3/uL (ref 150–400)
RBC: 4.35 MIL/uL (ref 4.22–5.81)
RDW: 12.1 % (ref 11.5–15.5)
WBC: 9.7 10*3/uL (ref 4.0–10.5)
nRBC: 0 % (ref 0.0–0.2)

## 2021-01-10 LAB — COMPREHENSIVE METABOLIC PANEL
ALT: 6 U/L (ref 0–44)
AST: 21 U/L (ref 15–41)
Albumin: 3.2 g/dL — ABNORMAL LOW (ref 3.5–5.0)
Alkaline Phosphatase: 78 U/L (ref 38–126)
Anion gap: 12 (ref 5–15)
BUN: 8 mg/dL (ref 6–20)
CO2: 29 mmol/L (ref 22–32)
Calcium: 8.8 mg/dL — ABNORMAL LOW (ref 8.9–10.3)
Chloride: 97 mmol/L — ABNORMAL LOW (ref 98–111)
Creatinine, Ser: 0.79 mg/dL (ref 0.61–1.24)
GFR, Estimated: >60 mL/min (ref >60)
Glucose, Bld: 86 mg/dL (ref 70–99)
Potassium: 4 mmol/L (ref 3.5–5.1)
Sodium: 138 mmol/L (ref 135–145)
Total Bilirubin: 0.9 mg/dL (ref 0.3–1.2)
Total Protein: 7 g/dL (ref 6.5–8.1)

## 2021-01-10 LAB — MAGNESIUM: Magnesium: 2 mg/dL (ref 1.7–2.4)

## 2021-01-10 LAB — STREP PNEUMONIAE URINARY ANTIGEN: Strep Pneumo Urinary Antigen: NEGATIVE

## 2021-01-10 MED ORDER — LEVALBUTEROL HCL 0.63 MG/3ML IN NEBU
0.6300 mg | INHALATION_SOLUTION | Freq: Two times a day (BID) | RESPIRATORY_TRACT | Status: DC
  Filled 2021-01-10: qty 3

## 2021-01-10 MED ORDER — IPRATROPIUM BROMIDE 0.02 % IN SOLN: 0.5000 mg | Freq: Two times a day (BID) | RESPIRATORY_TRACT | Status: DC

## 2021-01-10 MED ORDER — LEVOFLOXACIN 500 MG PO TABS: 500.0000 mg | ORAL_TABLET | Freq: Every day | ORAL | 0 refills | Status: AC

## 2021-01-10 NOTE — Discharge Summary (Signed)
Discharge Summary  [name]:[id number] DOB: 1966-06-18  PCP: Lalla Brothers, MD  Admit date: 01/08/2021 Discharge date: 01/10/2021  Time spent: 35 minutes  Recommendations for Outpatient Follow-up:  1. New medication: Levaquin 500 mg x 3 more days 2. Medication change: Recommending patient discontinue Valium 2 mg nightly 3. Medication change: Recommending patient discontinue scheduled Zanaflex 4. Patient going home with home health PT 5. Patient will follow up with his PCP in the next 2 weeks.  Recommending that his PCP do a reassessment of his chronic pain medication regimen.  See below.  Discharge Diagnoses:  Active Hospital Problems   Diagnosis Date Noted  . Left hip pain 01/08/2021  . Multilobar lung infiltrate 01/10/2021  . Syncope and collapse 01/08/2021  . Fall 01/08/2021  . Acute respiratory failure with hypoxia ([place])   . Major depressive disorder, recurrent episode, moderate ([place]) 10/18/2019    Resolved Hospital Problems  No resolved problems to display.    Discharge Condition: Improved, being discharged home follow recommendations patient go to skilled nursing facility  Diet recommendation: Regular diet  Vitals:   01/10/21 0806 01/10/21 1100  BP: 108/77 106/74  Pulse: 94 82  Resp: 18 20  Temp: 98.1 F (36.7 C) 98.7 F (37.1 C)  SpO2: 95% 93%    History of present illness:  Patient is a 55 year old male with past medical history of chronic central neuropathic pain, history of TBI, PTSD and depression who presented to the emergency room on 1/22 for worsening left hip pain.  Patient stated he had a fall about a week prior landing on his left hip.  He initially did not come to the emergency room as he had concerns about long wait times.  However, since then patient's had 2 more falls and thinks he might have even blacked out with the most recent fall after he hit his head.  He was not able to get appointment scheduled for called EMS was brought into  the emergency room.  His biggest complaint is of left hip pain.  In the emergency room, x-rays none noted no evidence of fracture.  However, chest x-ray noted patchy infiltrates representing multifocal pneumonia and a CT scan of the chest noted no evidence of PE, but did note patchy groundglass and consolidative opacities.  He was also noted to be hypoxic with O2 sat of 85% requiring 2 L.  Admitted to the hospitalist service.  Hospital Course:  Principal Problem:   Left hip pain: Patient with previous history of left hip arthroplasty.  MRI noted no evidence of any acute fracture.  Patient was seen by PT who recommended skilled nursing.  Patient declined this on 1/24 saying that he just wants to go home. Active Problems:   Major depressive disorder, recurrent episode, moderate ([place]): Continued on home medications.    Acute respiratory failure with hypoxia ([facility]) with findings of multilobar lung infiltrate seen on chest x-ray and CT: Unclear etiology.  Symptoms have since resolved and patient is now breathing comfortably on room air as of 1/24.  Noted procalcitonin level on admission was normal.  Covid test negative.  Patient was seen by pulmonary in consultation.  Infectious work-up for pneumonia started cretin checking for serum fungus, Legionella and histoplasma as well as AFB.  CRP minimally elevated.  It is possible that this could be aspiration or just oversedation.  See below.  Patient received several days of antibiotics in the hospital and was discharged on 3 more days of p.o. Levaquin.  Echocardiogram  normal as was BNP so heart failure ruled out    Syncope and collapse with fall: Overall quite deconditioned.  Patient did not except skilled nursing, but was amenable to home health PT.  I have serious concerns about the patient's home medication regimen.  Patient is on Duragesic 62 mcg patch every 3 days, Nucynta 100 every 12 hours, OxyIR 15 mg 5 times a day, Valium 2 mg nightly, trazodone 50 mg  nightly, Zanaflex 4 mg 3 times a day, Lyrica 150 mg twice daily and as needed Flexeril.  I have a strong suspicion the patient is overmedicated which is the contributing factor to his weakness and may even explain his hypoxia which resolved soon after.  This would account for normal procalcitonin level but hypoxia improving.  I have recommended in the immediate term for patient to discontinue his Valium as well as his scheduled muscle relaxer, but he is on a number of around-the-clock narcotic substances.  He is also underweight and lives alone and is at high risk for overdose.   Procedures:  Echocardiogram: Normal ejection fraction, no evidence of diastolic dysfunction  Consultations:  Pulmonary  Discharge Exam: BP 106/74 (BP Location: Left Arm)   Pulse 82   Temp 98.7 F (37.1 C) (Oral)   Resp 20   Ht 5' 9" (1.753 m)   Wt 54.4 kg   SpO2 93%   BMI 17.72 kg/m   General: Alert and oriented x3, no acute distress Cardiovascular: Regular rate and rhythm, S1-S2 Respiratory: Clear to auscultation bilaterally  Discharge Instructions You were cared for by a hospitalist during your hospital stay. If you have any questions about your discharge medications or the care you received while you were in the hospital after you are discharged, you can call the unit and asked to speak with the hospitalist on call if the hospitalist that took care of you is not available. Once you are discharged, your primary care physician will handle any further medical issues. Please note that NO REFILLS for any discharge medications will be authorized once you are discharged, as it is imperative that you return to your primary care physician (or establish a relationship with a primary care physician if you do not have one) for your aftercare needs so that they can reassess your need for medications and monitor your lab values.  Discharge Instructions    Diet - low sodium heart healthy   Complete by: As directed     Increase activity slowly   Complete by: As directed      Allergies as of 01/10/2021      Reactions   Haloperidol Shortness Of Breath   Reaction occurred at [facility] 10/15/2019   Erythromycin    Childhood Unknown reaction   Tapentadol Other (See Comments)   Nightmares   Topiramate    Mood changes   Imitrex [sumatriptan] Nausea Only, Palpitations      Medication List    STOP taking these medications   diazepam 2 MG tablet Commonly known as: VALIUM   tiZANidine 4 MG tablet Commonly known as: ZANAFLEX     TAKE these medications   ARIPiprazole 2 MG tablet Commonly known as: ABILIFY Take 2 mg by mouth daily.   cyanocobalamin 1000 MCG tablet Take by mouth.   cyclobenzaprine 10 MG tablet Commonly known as: FLEXERIL Take 10 mg by mouth 3 (three) times daily as needed.   diclofenac 75 MG EC tablet Commonly known as: VOLTAREN Take 75 mg by mouth daily. Occasionally only takes once  daily   fentaNYL 50 MCG/HR Commonly known as: [place] 1 patch onto the skin every 3 (three) days.   fentaNYL 12 MCG/HR Commonly known as: [place] 1 patch onto the skin every 3 (three) days.   finasteride 5 MG tablet Commonly known as: PROSCAR Take 5 mg by mouth daily.   FLUoxetine 20 MG capsule Commonly known as: PROZAC Take 3 capsules (60 mg) my mouth daily.   fluticasone 50 MCG/ACT nasal spray Commonly known as: FLONASE Place 1 spray into the nose daily.   ibuprofen 200 MG tablet Commonly known as: ADVIL Take 400 mg by mouth every 6 (six) hours as needed for mild pain.   levofloxacin 500 MG tablet Commonly known as: Levaquin Take 1 tablet (500 mg total) by mouth daily for 3 days.   meloxicam 15 MG tablet Commonly known as: MOBIC Take 15 mg by mouth daily.   metoprolol tartrate 25 MG tablet Commonly known as: LOPRESSOR Take 12.5 mg by mouth daily.   naloxone 4 MG/0.1ML Liqd nasal spray kit Commonly known as: NARCAN Place into the nose.   Nucynta ER 100 MG  12 hr tablet Generic drug: tapentadol Take 100 mg by mouth 2 (two) times daily.   omeprazole 20 MG capsule Commonly known as: PRILOSEC Take 20 mg by mouth daily.   oxyCODONE 15 MG immediate release tablet Commonly known as: ROXICODONE Take 15 mg by mouth 5 (five) times daily. 4-5x/daily   pregabalin 150 MG capsule Commonly known as: LYRICA Take 150 mg by mouth 2 (two) times daily.   tamsulosin 0.4 MG Caps capsule Commonly known as: FLOMAX Take 0.4 mg by mouth daily.   traZODone 50 MG tablet Commonly known as: DESYREL Take 50 mg by mouth at bedtime.   Vitamin D3 10 MCG (400 UNIT) tablet Take 3 tablets by mouth daily.      Allergies  Allergen Reactions  . Haloperidol Shortness Of Breath    Reaction occurred at [facility] 10/15/2019  . Erythromycin     Childhood Unknown reaction  . Tapentadol Other (See Comments)    Nightmares  . Topiramate     Mood changes  . Imitrex [Sumatriptan] Nausea Only and Palpitations      The results of significant diagnostics from this hospitalization (including imaging, microbiology, ancillary and laboratory) are listed below for reference.    Significant Diagnostic Studies: DG Chest 1 View  Result Date: 01/10/2021 CLINICAL DATA:  Shortness of breath EXAM: CHEST  1 VIEW COMPARISON:  01/08/2021 FINDINGS: Normal heart size and mediastinal contours. Mild interstitial coarsening. There is no edema, consolidation, effusion, or pneumothorax. Previously seen infiltrates have improved. Cervical disc arthroplasty. IMPRESSION: Improved aeration compared to 2 days ago. Electronically Signed   By: Monte Fantasia M.D.   On: 01/10/2021 06:08   DG Chest 1 View  Result Date: 01/08/2021 CLINICAL DATA:  Two falls today. Evidence of multifocal pneumonia on CT cervical spine. EXAM: CHEST  1 VIEW COMPARISON:  04/07/2020 FINDINGS: Cardiac enlargement. Patchy infiltrates demonstrated in the upper and mid lungs bilaterally likely representing multifocal pneumonia.  No pleural effusions. No pneumothorax. Mediastinal contours appear intact. Postoperative changes in the lower cervical spine and left shoulder. IMPRESSION: Patchy infiltrates in the upper and mid lungs bilaterally likely representing multifocal pneumonia. Electronically Signed   By: Lucienne Capers M.D.   On: 01/08/2021 01:43   CT Head Wo Contrast  Result Date: 01/08/2021 CLINICAL DATA:  Two falls today with loss of consciousness on the second fall, striking head. EXAM: CT  HEAD WITHOUT CONTRAST CT CERVICAL SPINE WITHOUT CONTRAST TECHNIQUE: Multidetector CT imaging of the head and cervical spine was performed following the standard protocol without intravenous contrast. Multiplanar CT image reconstructions of the cervical spine were also generated. COMPARISON:  MRI cervical spine 10/16/2019.  CT head 10/15/2019 FINDINGS: CT HEAD FINDINGS Brain: No evidence of acute infarction, hemorrhage, hydrocephalus, extra-axial collection or mass lesion/mass effect. Vascular: No hyperdense vessel or unexpected calcification. Skull: Normal. Negative for fracture or focal lesion. Sinuses/Orbits: No acute finding. Other: None. CT CERVICAL SPINE FINDINGS Alignment: Straightening of the usual cervical lordosis is likely positional but muscle spasm could also have this appearance. No anterior subluxation. Normal alignment of the facet joints. C1-2 articulation appears intact. Skull base and vertebrae: Skull base appears intact. No vertebral compression deformities. No focal bone lesion or bone destruction. Soft tissues and spinal canal: No prevertebral soft tissue swelling. No abnormal paraspinal soft tissue mass or infiltration. 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 Mild degenerative changes at C4-5 and C6-7 levels. Upper chest: Patchy nodular infiltrates are suggested in the lung apices suggesting multifocal pneumonia. Other: None. IMPRESSION: 1. No acute intracranial abnormalities. 2. Nonspecific straightening of  usual cervical lordosis. No acute displaced fractures identified in the cervical spine. 3. 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 4. Patchy nodular infiltrates in the lung apices suggesting multifocal pneumonia. Electronically Signed   By: Lucienne Capers M.D.   On: 01/08/2021 01:04   CT Chest Wo Contrast  Result Date: 01/08/2021 CLINICAL DATA:  Decreased oxygen saturation. EXAM: CT CHEST WITHOUT CONTRAST TECHNIQUE: Multidetector CT imaging of the chest was performed following the standard protocol without IV contrast. COMPARISON:  Chest radiograph earlier same day. FINDINGS: Cardiovascular: Mildly enlarged heart size. Trace fluid superior pericardial recess. Coronary arterial vascular calcifications. Mediastinum/Nodes: No axillary lymphadenopathy. 1.1 cm right paratracheal lymph node (image 35; series 2). Limited evaluation of the hilar structures due to lack of contrast. Normal appearance of the esophagus. Lungs/Pleura: Central airways are patent. Patchy ground-glass and consolidative opacities within the lower lobes bilaterally. Additionally there are larger areas of consolidation predominately within the right upper lobe however also visualized within the lingula, left upper lobe and right middle lobes. No large pleural effusion or pneumothorax. Upper Abdomen: No acute process. Musculoskeletal: No aggressive or acute appearing osseous lesions. IMPRESSION: 1. Patchy ground-glass and consolidative opacities within the lower lobes bilaterally as well as larger areas of consolidation predominately within the right upper lobe however also visualized within the lingula, left upper lobe and right middle lobes. Findings are concerning for multifocal pneumonia. Followup chest CT is recommended in 3-4 weeks following trial of antibiotic therapy to ensure resolution and exclude underlying malignancy. 2. Mildly enlarged right paratracheal lymph node, likely reactive in etiology. 3. Aortic atherosclerosis. Aortic  Atherosclerosis (ICD10-I70.0). Electronically Signed   By: Lovey Newcomer M.D.   On: 01/08/2021 07:54   CT Cervical Spine Wo Contrast  Result Date: 01/08/2021 CLINICAL DATA:  Two falls today with loss of consciousness on the second fall, striking head. EXAM: CT HEAD WITHOUT CONTRAST CT CERVICAL SPINE WITHOUT CONTRAST TECHNIQUE: Multidetector CT imaging of the head and cervical spine was performed following the standard protocol without intravenous contrast. Multiplanar CT image reconstructions of the cervical spine were also generated. COMPARISON:  MRI cervical spine 10/16/2019.  CT head 10/15/2019 FINDINGS: CT HEAD FINDINGS Brain: No evidence of acute infarction, hemorrhage, hydrocephalus, extra-axial collection or mass lesion/mass effect. Vascular: No hyperdense vessel or unexpected calcification. Skull: Normal. Negative for fracture or focal lesion. Sinuses/Orbits:  No acute finding. Other: None. CT CERVICAL SPINE FINDINGS Alignment: Straightening of the usual cervical lordosis is likely positional but muscle spasm could also have this appearance. No anterior subluxation. Normal alignment of the facet joints. C1-2 articulation appears intact. Skull base and vertebrae: Skull base appears intact. No vertebral compression deformities. No focal bone lesion or bone destruction. Soft tissues and spinal canal: No prevertebral soft tissue swelling. No abnormal paraspinal soft tissue mass or infiltration. 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 Mild degenerative changes at C4-5 and C6-7 levels. Upper chest: Patchy nodular infiltrates are suggested in the lung apices suggesting multifocal pneumonia. Other: None. IMPRESSION: 1. No acute intracranial abnormalities. 2. Nonspecific straightening of usual cervical lordosis. No acute displaced fractures identified in the cervical spine. 3. 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 4. Patchy nodular infiltrates in the lung apices suggesting multifocal pneumonia.  Electronically Signed   By: Lucienne Capers M.D.   On: 01/08/2021 01:04   CT Hip Left Wo Contrast  Result Date: 01/08/2021 CLINICAL DATA:  Hip trauma with fracture suspected. EXAM: CT OF THE LEFT HIP WITHOUT CONTRAST TECHNIQUE: Multidetector CT imaging of the left hip was performed according to the standard protocol. Multiplanar CT image reconstructions were also generated. COMPARISON:  Radiography from earlier today FINDINGS: Bones/Joint/Cartilage Remote left femoral neck fracture with fixation using 3 cannulated screws. The fracture is healed. Remote right inferior pubic ramus fracture. No acute fracture or hip dislocation. Negative for hip joint effusion. Ligaments Suboptimally assessed by CT. Muscles and Tendons Musculotendinous no visible swelling or disruption. Soft tissues No visible injury. IMPRESSION: 1. No acute finding. 2. Remote and healed left femoral neck fracture. Electronically Signed   By: Monte Fantasia M.D.   On: 01/08/2021 07:58   MR HIP LEFT WO CONTRAST  Result Date: 01/08/2021 CLINICAL DATA:  Left hip pain after fall EXAM: MR OF THE LEFT HIP WITHOUT CONTRAST TECHNIQUE: Multiplanar, multisequence MR imaging was performed. No intravenous contrast was administered. COMPARISON:  X-ray and CT dated 01/08/2021 FINDINGS: Bones: Patient is status post ORIF of a left femoral neck fracture via 3 cannulated screws. Mild associated susceptibility artifact. No acute fracture. No dislocation. No evidence of femoral head avascular necrosis. No bone marrow edema. No suspicious marrow replacing lesion. No significant arthropathy of the SI joints or right hip. Articular cartilage and labrum Articular cartilage: Evaluation degraded by susceptibility artifact. No large cartilage defects identified within this limitation. No subchondral marrow signal changes. Labrum:  Poorly evaluated. Joint or bursal effusion Joint effusion:  No hip joint effusion. Bursae: No bursal fluid collection. Muscles and tendons  Muscles and tendons: There is intramuscular edema and a small amount of intramuscular fluid signal adjacent to the right hip within the right gluteus medius, tensor fascia lata, and vastus lateralis muscles. Small focus of intramuscular edema within the left gluteus maximus muscle overlying the left greater trochanter. The gluteal, hamstring, iliopsoas, rectus femoris, and adductor tendons appear intact without tear or significant tendinosis. No muscle atrophy or fatty infiltration. Other findings Miscellaneous: No organized soft tissue fluid collection or hematoma. No inguinal lymphadenopathy. IMPRESSION: 1. No acute osseous abnormality of the LEFT hip. 2. Intramuscular edema and a small amount of intramuscular fluid signal adjacent to the RIGHT hip within the RIGHT gluteus medius, tensor fascia lata, and vastus lateralis muscles. There is also a small focus of intramuscular edema within the LEFT gluteus maximus muscle overlying the LEFT greater trochanter. Findings are favored to reflect muscle strains. 3. No organized soft tissue fluid collection or  hematoma. Electronically Signed   By: Davina Poke D.O.   On: 01/08/2021 13:11   US Venous Img Lower Bilateral (DVT)  Result Date: 01/09/2021 CLINICAL DATA:  Elevated D-dimer EXAM: BILATERAL LOWER EXTREMITY VENOUS DOPPLER ULTRASOUND TECHNIQUE: Gray-scale sonography with graded compression, as well as color Doppler and duplex ultrasound were performed to evaluate the lower extremity deep venous systems from the level of the common femoral vein and including the common femoral, femoral, profunda femoral, popliteal and calf veins including the posterior tibial, peroneal and gastrocnemius veins when visible. The superficial great saphenous vein was also interrogated. Spectral Doppler was utilized to evaluate flow at rest and with distal augmentation maneuvers in the common femoral, femoral and popliteal veins. COMPARISON:  None. FINDINGS: RIGHT LOWER EXTREMITY  Common Femoral Vein: No evidence of thrombus. Normal compressibility, respiratory phasicity and response to augmentation. Saphenofemoral Junction: No evidence of thrombus. Normal compressibility and flow on color Doppler imaging. Profunda Femoral Vein: No evidence of thrombus. Normal compressibility and flow on color Doppler imaging. Femoral Vein: No evidence of thrombus. Normal compressibility, respiratory phasicity and response to augmentation. Popliteal Vein: No evidence of thrombus. Normal compressibility, respiratory phasicity and response to augmentation. Calf Veins: No evidence of thrombus. Normal compressibility and flow on color Doppler imaging. Superficial Great Saphenous Vein: No evidence of thrombus. Normal compressibility. Other Findings:  None. LEFT LOWER EXTREMITY Common Femoral Vein: No evidence of thrombus. Normal compressibility, respiratory phasicity and response to augmentation. Saphenofemoral Junction: No evidence of thrombus. Normal compressibility and flow on color Doppler imaging. Profunda Femoral Vein: No evidence of thrombus. Normal compressibility and flow on color Doppler imaging. Femoral Vein: No evidence of thrombus. Normal compressibility, respiratory phasicity and response to augmentation. Popliteal Vein: No evidence of thrombus. Normal compressibility, respiratory phasicity and response to augmentation. Calf Veins: No evidence of thrombus. Normal compressibility and flow on color Doppler imaging. Superficial Great Saphenous Vein: No evidence of thrombus. Normal compressibility. Other Findings:  None. IMPRESSION: No evidence of deep venous thrombosis in either lower extremity. Electronically Signed   By: Constance Holster M.D.   On: 01/09/2021 19:00   ECHOCARDIOGRAM COMPLETE  Result Date: 01/09/2021    ECHOCARDIOGRAM REPORT   Patient Name:   MIGUEL CHRISTIANA Date of Exam: 01/09/2021 Medical Rec #:  [id number]       Height:       69.0 in Accession #:    [id number]      Weight:        120.0 lb Date of Birth:  11-10-1966       BSA:          1.663 m Patient Age:    55 years        BP:           111/63 mmHg Patient Gender: M               HR:           69 bpm. Exam Location:  [facility] Procedure: 2D Echo, Cardiac Doppler and Color Doppler Indications:     Syncope 780.2 / R55  History:         Patient has prior history of Echocardiogram examinations.  Sonographer:     Alyse Low Roar Referring Phys:  [id number] AGBATA Diagnosing Phys: Nelva Bush MD IMPRESSIONS  1. Left ventricular ejection fraction, by estimation, is 50 to 55%. The left ventricle has low normal function. The left ventricle has no regional wall motion abnormalities. Left ventricular diastolic parameters were normal.  2.  Right ventricular systolic function is normal. The right ventricular size is normal.  3. The mitral valve is normal in structure. Trivial mitral valve regurgitation.  4. The aortic valve is tricuspid. Aortic valve regurgitation is not visualized. No aortic stenosis is present. FINDINGS  Left Ventricle: Left ventricular ejection fraction, by estimation, is 50 to 55%. The left ventricle has low normal function. The left ventricle has no regional wall motion abnormalities. The left ventricular internal cavity size was normal in size. There is no left ventricular hypertrophy. Left ventricular diastolic parameters were normal. Right Ventricle: Pulmonary artery pressure is normal (~20 mmHg plus central venous pressure). The right ventricular size is normal. No increase in right ventricular wall thickness. Right ventricular systolic function is normal. Left Atrium: Left atrial size was normal in size. Right Atrium: Right atrial size was normal in size. Pericardium: The pericardium was not well visualized. Mitral Valve: The mitral valve is normal in structure. Trivial mitral valve regurgitation. Tricuspid Valve: The tricuspid valve is normal in structure. Tricuspid valve regurgitation is mild. Aortic Valve: The aortic valve  is tricuspid. Aortic valve regurgitation is not visualized. No aortic stenosis is present. Aortic valve peak gradient measures 5.6 mmHg. Pulmonic Valve: The pulmonic valve was normal in structure. Pulmonic valve regurgitation is trivial. No evidence of pulmonic stenosis. Aorta: The aortic root is normal in size and structure. Pulmonary Artery: The pulmonary artery is not well seen. Venous: The inferior vena cava was not well visualized. IAS/Shunts: The interatrial septum was not well visualized.  LEFT VENTRICLE PLAX 2D LVIDd:         4.72 cm  Diastology LVIDs:         3.36 cm  LV e' medial:    9.68 cm/s LV PW:         0.92 cm  LV E/e' medial:  9.3 LV IVS:        0.87 cm  LV e' lateral:   12.80 cm/s LVOT diam:     1.90 cm  LV E/e' lateral: 7.0 LVOT Area:     2.84 cm  RIGHT VENTRICLE RV Mid diam:    3.32 cm RV S prime:     14.50 cm/s TAPSE (M-mode): 2.4 cm LEFT ATRIUM             Index       RIGHT ATRIUM           Index LA diam:        3.50 cm 2.10 cm/m  RA Area:     14.70 cm LA Vol (A2C):   54.8 ml 32.96 ml/m RA Volume:   34.50 ml  20.75 ml/m LA Vol (A4C):   35.1 ml 21.11 ml/m LA Biplane Vol: 44.3 ml 26.64 ml/m  AORTIC VALVE                PULMONIC VALVE AV Area (Vmax): 2.47 cm    PV Vmax:        1.05 m/s AV Vmax:        118.00 cm/s PV Peak grad:   4.4 mmHg AV Peak Grad:   5.6 mmHg    RVOT Peak grad: 3 mmHg LVOT Vmax:      103.00 cm/s  AORTA Ao Root diam: 2.60 cm MITRAL VALVE               TRICUSPID VALVE MV Area (PHT): 4.86 cm    TR Peak grad:   20.1 mmHg MV Decel Time: 156 msec    TR  Vmax:        224.00 cm/s MV E velocity: 90.00 cm/s MV A velocity: 73.30 cm/s  SHUNTS MV E/A ratio:  1.23        Systemic Diam: 1.90 cm MV A Prime:    9.8 cm/s Nelva Bush MD Electronically signed by Nelva Bush MD Signature Date/Time: 01/09/2021/12:53:17 PM    Final    DG HIP UNILAT WITH PELVIS 2-3 VIEWS LEFT  Result Date: 01/08/2021 CLINICAL DATA:  Left hip pain after a fall EXAM: DG HIP (WITH OR WITHOUT PELVIS)  2-3V LEFT COMPARISON:  05/03/2020 FINDINGS: Previous internal fixation of the left proximal femur with 3 screws. No change in appearance of the hardware. Deformity of the subcapital left femur consistent with old healed fracture. No evidence of acute fracture or dislocation. Pelvis appears intact. SI joints and symphysis pubis are not displaced. Soft tissues are unremarkable. IMPRESSION: No acute bony abnormalities. Postoperative internal fixation of old healed subcapital left femoral neck fracture. Electronically Signed   By: Lucienne Capers M.D.   On: 01/08/2021 01:52    Microbiology: Recent Results (from the past 240 hour(s))  SARS Coronavirus 2 by RT PCR (hospital order, performed in [facility] hospital lab) Nasopharyngeal Nasopharyngeal Swab     Status: None   Collection Time: 01/08/21  4:53 AM   Specimen: Nasopharyngeal Swab  Result Value Ref Range Status   SARS Coronavirus 2 NEGATIVE NEGATIVE Final    Comment: (NOTE) SARS-CoV-2 target nucleic acids are NOT DETECTED.  The SARS-CoV-2 RNA is generally detectable in upper and lower respiratory specimens during the acute phase of infection. The lowest concentration of SARS-CoV-2 viral copies this assay can detect is 250 copies / mL. A negative result does not preclude SARS-CoV-2 infection and should not be used as the sole basis for treatment or other patient management decisions.  A negative result may occur with improper specimen collection / handling, submission of specimen other than nasopharyngeal swab, presence of viral mutation(s) within the areas targeted by this assay, and inadequate number of viral copies (<250 copies / mL). A negative result must be combined with clinical observations, patient history, and epidemiological information.  Fact Sheet for Patients:   StrictlyIdeas.no  Fact Sheet for Healthcare Providers: BankingDealers.co.za  This test is not yet approved or   cleared by the [place] FDA and has been authorized for detection and/or diagnosis of SARS-CoV-2 by FDA under an Emergency Use Authorization (EUA).  This EUA will remain in effect (meaning this test can be used) for the duration of the COVID-19 declaration under Section 564(b)(1) of the Act, 21 U.S.C. section 360bbb-3(b)(1), unless the authorization is terminated or revoked sooner.  Performed at [facility], [address] [place], [place] [address]   CULTURE, BLOOD (ROUTINE X 2) w Reflex to ID Panel     Status: None (Preliminary result)   Collection Time: 01/09/21  2:01 PM   Specimen: BLOOD  Result Value Ref Range Status   Specimen Description BLOOD BLOOD LEFT HAND  Final   Special Requests   Final    BOTTLES DRAWN AEROBIC AND ANAEROBIC Blood Culture adequate volume   Culture   Final    NO GROWTH < 24 HOURS Performed at [facility], [address] [place], [place] [address]    Report Status PENDING  Incomplete  CULTURE, BLOOD (ROUTINE X 2) w Reflex to ID Panel     Status: None (Preliminary result)   Collection Time: 01/09/21  2:11 PM   Specimen:  BLOOD  Result Value Ref Range Status   Specimen Description BLOOD BLOOD RIGHT HAND  Final   Special Requests   Final    BOTTLES DRAWN AEROBIC AND ANAEROBIC Blood Culture results may not be optimal due to an excessive volume of blood received in culture bottles   Culture   Final    NO GROWTH < 24 HOURS Performed at [facility], [address] [place], [place] [address]    Report Status PENDING  Incomplete  MRSA PCR Screening     Status: None   Collection Time: 01/09/21  3:05 PM   Specimen: Nasopharyngeal  Result Value Ref Range Status   MRSA by PCR NEGATIVE NEGATIVE Final    Comment:        The GeneXpert MRSA Assay (FDA approved for NASAL specimens only), is one component of a comprehensive MRSA colonization surveillance program. It is not intended to diagnose MRSA infection nor to guide  or monitor treatment for MRSA infections. Performed at [facility], [address] [place], [place] [address]      Labs: Basic Metabolic Panel: Recent Labs  Lab 01/08/21 0044 01/09/21 0422 01/10/21 0706  NA 139 138 138  K 4.1 3.8 4.0  CL 98 97* 97*  CO2 27 32 29  GLUCOSE 82 97 86  BUN 7 <5* 8  CREATININE 0.87 0.71 0.79  CALCIUM 8.1* 8.3* 8.8*  MG  --   --  2.0  PHOS  --   --  2.8   Liver Function Tests: Recent Labs  Lab 01/10/21 0706  AST 21  ALT 6  ALKPHOS 78  BILITOT 0.9  PROT 7.0  ALBUMIN 3.2*   No results for input(s): LIPASE, AMYLASE in the last 168 hours. No results for input(s): AMMONIA in the last 168 hours. CBC: Recent Labs  Lab 01/08/21 0044 01/09/21 0422 01/10/21 0706  WBC 8.1 9.4 9.7  NEUTROABS  --   --  7.8*  HGB 10.8* 11.7* 12.6*  HCT 33.9* 36.1* 37.9*  MCV 90.6 89.4 87.1  PLT 251 341 364   Cardiac Enzymes: No results for input(s): CKTOTAL, CKMB, CKMBINDEX, TROPONINI in the last 168 hours. BNP: BNP (last 3 results) No results for input(s): BNP in the last 8760 hours.  ProBNP (last 3 results) No results for input(s): PROBNP in the last 8760 hours.  CBG: No results for input(s): GLUCAP in the last 168 hours.     Signed:  Annita Brod, MD [facility] 01/10/2021, 12:03 PM

## 2021-01-10 NOTE — Progress Notes (Signed)
Patient verbalizes understanding of discharge instructions. Patient discharged home, daughter is picking the patient up.

## 2021-01-10 NOTE — Progress Notes (Signed)
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 Nelva Bush MD IMPRESSIONS  1. Left ventricular ejection fraction, by estimation, is 50 to 55%. The left ventricle has low normal function. The left ventricle has no regional wall motion abnormalities. Left ventricular diastolic parameters were normal.  2. Right ventricular systolic function is normal. The right ventricular size is normal.  3. The mitral valve is normal in structure. Trivial mitral valve regurgitation.  4. The aortic valve is tricuspid. Aortic valve regurgitation is not visualized. No aortic stenosis is present. FINDINGS  Left Ventricle: Left ventricular ejection fraction, by estimation, is 50 to 55%. The left ventricle has low normal function. The left ventricle has no regional wall motion abnormalities. The left ventricular internal cavity size was normal in size. There is no left ventricular hypertrophy. Left ventricular diastolic parameters were normal. Right Ventricle: Pulmonary artery pressure is normal (~20 mmHg plus central venous pressure). The right ventricular size is normal. No increase in right ventricular wall thickness. Right ventricular systolic function is normal. Left Atrium: Left atrial  size was normal in size. Right Atrium: Right atrial size was normal in size. Pericardium: The pericardium was not well visualized. Mitral Valve: The mitral valve is normal in structure. Trivial mitral valve regurgitation. Tricuspid Valve: The tricuspid valve is normal in structure. Tricuspid valve regurgitation is mild. Aortic Valve: The aortic valve is tricuspid. Aortic valve regurgitation is not visualized. No aortic stenosis is  present. Aortic valve peak gradient measures 5.6 mmHg. Pulmonic Valve: The pulmonic valve was normal in structure. Pulmonic valve regurgitation is trivial. No evidence of pulmonic stenosis. Aorta: The aortic root is normal in size and structure. Pulmonary Artery: The pulmonary artery is not well seen. Venous: The inferior vena cava was not well visualized. IAS/Shunts: The interatrial septum was not well visualized.  LEFT VENTRICLE PLAX 2D LVIDd:         4.72 cm  Diastology LVIDs:         3.36 cm  LV e' medial:    9.68 cm/s LV PW:         0.92 cm  LV E/e' medial:  9.3 LV IVS:        0.87 cm  LV e' lateral:   12.80 cm/s LVOT diam:     1.90 cm  LV E/e' lateral: 7.0 LVOT Area:     2.84 cm  RIGHT VENTRICLE RV Mid diam:    3.32 cm RV S prime:     14.50 cm/s TAPSE (M-mode): 2.4 cm LEFT ATRIUM             Index       RIGHT ATRIUM           Index LA diam:        3.50 cm 2.10 cm/m  RA Area:     14.70 cm LA Vol (A2C):   54.8 ml 32.96 ml/m RA Volume:   34.50 ml  20.75 ml/m LA Vol (A4C):   35.1 ml 21.11 ml/m LA Biplane Vol: 44.3 ml 26.64 ml/m  AORTIC VALVE                PULMONIC VALVE AV Area (Vmax): 2.47 cm    PV Vmax:        1.05 m/s AV Vmax:        118.00 cm/s PV Peak grad:   4.4 mmHg AV Peak Grad:   5.6 mmHg    RVOT Peak grad: 3 mmHg LVOT Vmax:      103.00 cm/s  AORTA Ao Root diam: 2.60 cm MITRAL VALVE               TRICUSPID VALVE MV Area (PHT): 4.86 cm    TR Peak grad:   20.1 mmHg MV Decel Time: 156 msec    TR Vmax:        224.00 cm/s MV E velocity: 90.00 cm/s MV A velocity: 73.30 cm/s   SHUNTS MV E/A ratio:  1.23        Systemic Diam: 1.90 cm MV A Prime:    9.8 cm/s Nelva Bush MD Electronically signed by Nelva Bush MD Signature Date/Time: 01/09/2021/12:53:17 PM    Final    DG HIP UNILAT WITH PELVIS 2-3 VIEWS LEFT  Result Date: 01/08/2021 CLINICAL DATA:  Left hip pain after a fall EXAM: DG HIP (WITH OR WITHOUT PELVIS) 2-3V LEFT COMPARISON:  05/03/2020 FINDINGS: Previous internal fixation of the left proximal femur with 3 screws. No change in appearance of the hardware. Deformity of the subcapital left femur consistent with old healed fracture. No evidence of acute fracture or dislocation. Pelvis appears intact. SI joints and symphysis pubis are not displaced. Soft tissues are unremarkable. IMPRESSION: No acute bony abnormalities. Postoperative internal fixation of old healed subcapital left femoral neck fracture. Electronically Signed   By: Lucienne Capers M.D.   On: 01/08/2021 01:52      ASSESSMENT/PLAN     Acute hypoxemic respiratory failure -  patient has multifocal pneumonia with bilateral infiltrate present on admission  - COVIDnegative x 2  - patient is now off supplemental O2 during my evaluation and was normoxic - will perform infectious workup for pneumonia -serum fungitell -legionella ab -strep pneumonia ur AG -Histoplasma Ur Ag -sputum resp cultures -AFB sputum expectorated specimen -sputum cytology  - patient has history of RA - will obtain rheumatoid factor for possible acute exacerbation of underlying ILD - ESR -ANA comprehensive panel  -PT/OT for d/c planning     Thank you for allowing me to participate in the care of this patient.   Patient/Family are satisfied with care plan and all questions have been answered.  This document was prepared using Dragon voice recognition software and may include unintentional dictation errors.     Ottie Glazier, M.D.  Division of [place]

## 2021-01-10 NOTE — Telephone Encounter (Signed)
Patient's daughter called and patient placed on the phone. He says he is having abdominal pain and nausea like it was when he was in the hospital. He says the doctor's couldn't find anything out about the pain. He says he's been having BM's today, so no problem there. He says he's taken his pain medication and the pain is still a 10. He says he's taken Zofran for nausea. I advised if the pain doesn't get any better to go to the ED. Otherwise call PCP tomorrow to schedule a visit for hospital follow up and nausea. Patient verbalized understanding.  Reason for Disposition . [1] MODERATE pain (e.g., interferes with normal activities) AND [2] pain comes and goes (cramps) AND [3] present > 24 hours  (Exception: pain with Vomiting or Diarrhea - see that Guideline)  Answer Assessment - Initial Assessment Questions 1. LOCATION: "Where does it hurt?"      Center of abdomen 2. RADIATION: "Does the pain shoot anywhere else?" (e.g., chest, back)     No 3. ONSET: "When did the pain begin?" (Minutes, hours or days ago)      09-10 this morning 4. SUDDEN: "Gradual or sudden onset?"     Gradual 5. PATTERN "Does the pain come and go, or is it constant?"    - If constant: "Is it getting better, staying the same, or worsening?"      (Note: Constant means the pain never goes away completely; most serious pain is constant and it progresses)     - If intermittent: "How long does it last?" "Do you have pain now?"     (Note: Intermittent means the pain goes away completely between bouts)     Constant 6. SEVERITY: "How bad is the pain?"  (e.g., Scale 1-10; mild, moderate, or severe)    - MILD (1-3): doesn't interfere with normal activities, abdomen soft and not tender to touch     - MODERATE (4-7): interferes with normal activities or awakens from sleep, tender to touch     - SEVERE (8-10): excruciating pain, doubled over, unable to do any normal activities       10 7. RECURRENT SYMPTOM: "Have you ever had this type  of stomach pain before?" If Yes, ask: "When was the last time?" and "What happened that time?"       Something new 8. CAUSE: "What do you think is causing the stomach pain?"     I don't know 9. RELIEVING/AGGRAVATING FACTORS: "What makes it better or worse?" (e.g., movement, antacids, bowel movement)     Nothing 10. OTHER SYMPTOMS: "Has there been any vomiting, diarrhea, constipation, or urine problems?"       Nausea  Protocols used: ABDOMINAL PAIN - MALE-A-AH
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
## 2021-01-11 LAB — ANA COMPREHENSIVE PANEL

## 2021-01-11 LAB — LEGIONELLA PNEUMOPHILA SEROGP 1 UR AG: L. pneumophila Serogp 1 Ur Ag: NEGATIVE

## 2021-01-11 LAB — RHEUMATOID FACTOR: Rheumatoid fact SerPl-aCnc: <10 IU/mL (ref <14.0)

## 2021-01-12 ENCOUNTER — Encounter: Payer: Self-pay | Admitting: Emergency Medicine

## 2021-01-12 ENCOUNTER — Other Ambulatory Visit: Payer: Self-pay

## 2021-01-12 ENCOUNTER — Emergency Department
Admission: EM | Admit: 2021-01-12 | Discharge: 2021-01-12 | Disposition: A | Discharge status: Home / Self Care | Payer: Medicare HMO | Attending: Emergency Medicine | Admitting: Emergency Medicine

## 2021-01-12 DIAGNOSIS — R0602 Shortness of breath: Secondary | ICD-10-CM | POA: Diagnosis not present

## 2021-01-12 DIAGNOSIS — R55 Syncope and collapse: Secondary | ICD-10-CM | POA: Insufficient documentation

## 2021-01-12 DIAGNOSIS — Z5321 Procedure and treatment not carried out due to patient leaving prior to being seen by health care provider: Secondary | ICD-10-CM | POA: Diagnosis not present

## 2021-01-12 DIAGNOSIS — R031 Nonspecific low blood-pressure reading: Secondary | ICD-10-CM | POA: Insufficient documentation

## 2021-01-12 LAB — BASIC METABOLIC PANEL
Anion gap: 10 (ref 5–15)
BUN: 10 mg/dL (ref 6–20)
CO2: 32 mmol/L (ref 22–32)
Calcium: 8.5 mg/dL — ABNORMAL LOW (ref 8.9–10.3)
Chloride: 93 mmol/L — ABNORMAL LOW (ref 98–111)
Creatinine, Ser: 0.93 mg/dL (ref 0.61–1.24)
GFR, Estimated: >60 mL/min (ref >60)
Glucose, Bld: 84 mg/dL (ref 70–99)
Potassium: 3.6 mmol/L (ref 3.5–5.1)
Sodium: 135 mmol/L (ref 135–145)

## 2021-01-12 LAB — CBC
HCT: 34 % — ABNORMAL LOW (ref 39.0–52.0)
Hemoglobin: 11.1 g/dL — ABNORMAL LOW (ref 13.0–17.0)
MCH: 28.5 pg (ref 26.0–34.0)
MCHC: 32.6 g/dL (ref 30.0–36.0)
MCV: 87.4 fL (ref 80.0–100.0)
Platelets: 357 10*3/uL (ref 150–400)
RBC: 3.89 MIL/uL — ABNORMAL LOW (ref 4.22–5.81)
RDW: 12.6 % (ref 11.5–15.5)
WBC: 6.6 10*3/uL (ref 4.0–10.5)
nRBC: 0 % (ref 0.0–0.2)

## 2021-01-12 LAB — FUNGITELL, SERUM: Fungitell Result: <31 pg/mL (ref <80)

## 2021-01-12 LAB — LEGIONELLA PNEUMOPHILA TOTAL AB: Legionella Pneumo Total Ab: 1.01 OD ratio — ABNORMAL HIGH (ref 0.00–0.90)

## 2021-01-12 NOTE — ED Triage Notes (Signed)
Pt reports at home today his BP kept dropping and he would get dizzy. Pt states was recently admitted and d/c for pneumonia. Pt reports when he gets these episodes his vision gets blurry and he feels dizzy and his BP drops.

## 2021-01-12 NOTE — ED Triage Notes (Signed)
Pt in via EMS from home with c/o near syncopal episodes. When pt stands his BP is low, 112/80, symptoms for 4 days. Pt also with SOB for 6 hours and was dx'd with pneumonia this weekend  #18 g to left [facility]

## 2021-01-14 LAB — CULTURE, BLOOD (ROUTINE X 2)
Culture: NO GROWTH
Culture: NO GROWTH
Special Requests: ADEQUATE

## 2021-02-22 DIAGNOSIS — D649 Anemia, unspecified: Secondary | ICD-10-CM | POA: Insufficient documentation

## 2021-02-22 DIAGNOSIS — E559 Vitamin D deficiency, unspecified: Secondary | ICD-10-CM | POA: Insufficient documentation

## 2021-03-23 IMAGING — US US EXTREM LOW VENOUS*L*
1 series · 14 of 24 positions shown · non-contrast
Comparison: None

CLINICAL DATA: LEFT leg pain and swelling, hip surgery 3 weeks ago

EXAM:
LEFT LOWER EXTREMITY VENOUS DOPPLER ULTRASOUND
TECHNIQUE: Gray-scale sonography with compression, as well as color and duplex
ultrasound, were performed to evaluate the deep venous system(s)
from the level of the common femoral vein through the popliteal and
proximal calf veins.

[Series 1: us venous img lower uni left (dvt) · portal-venous · 14 of 34 slices shown]
[im 1/34]
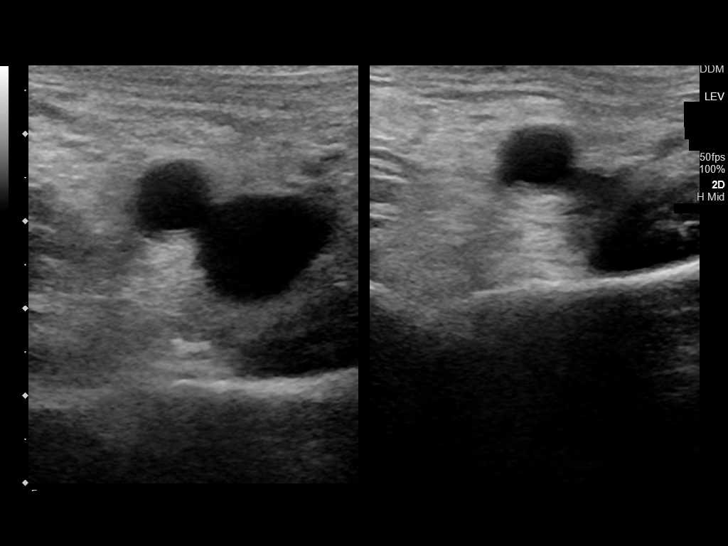
[im 3/34]
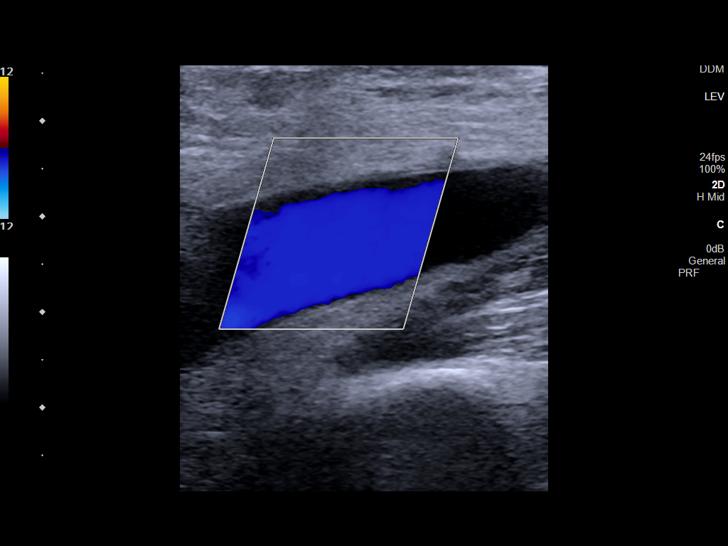
[im 6/34]
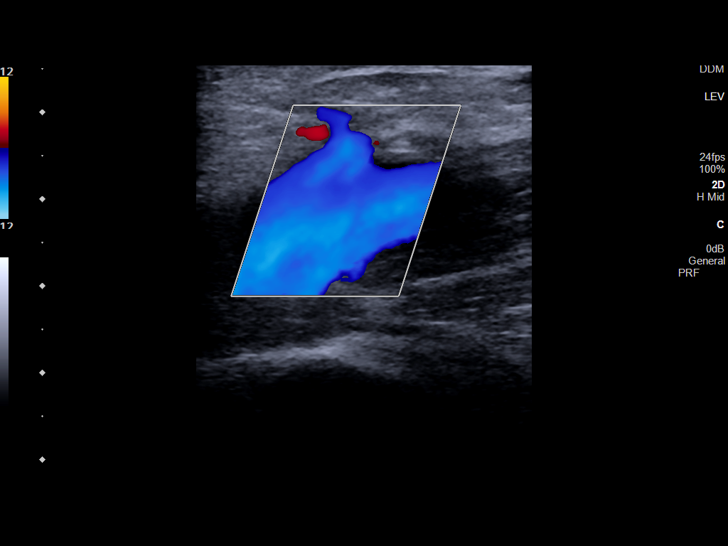
[im 9/34]
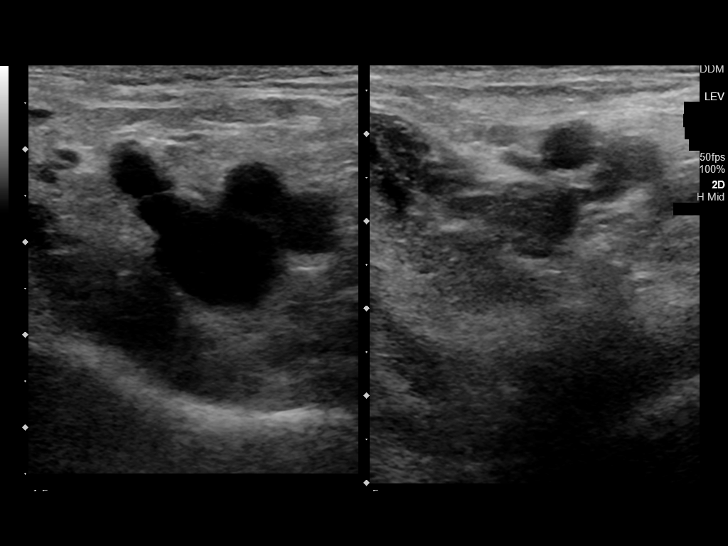
[im 11/34]
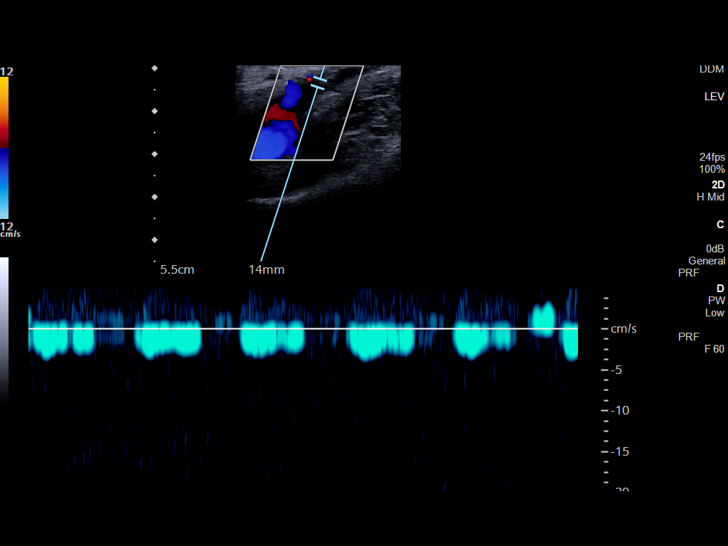
[im 13/34]
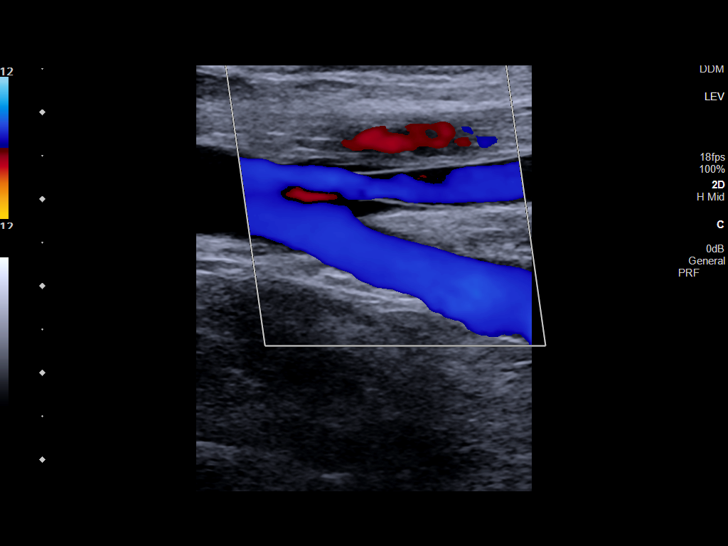
[im 16/34]
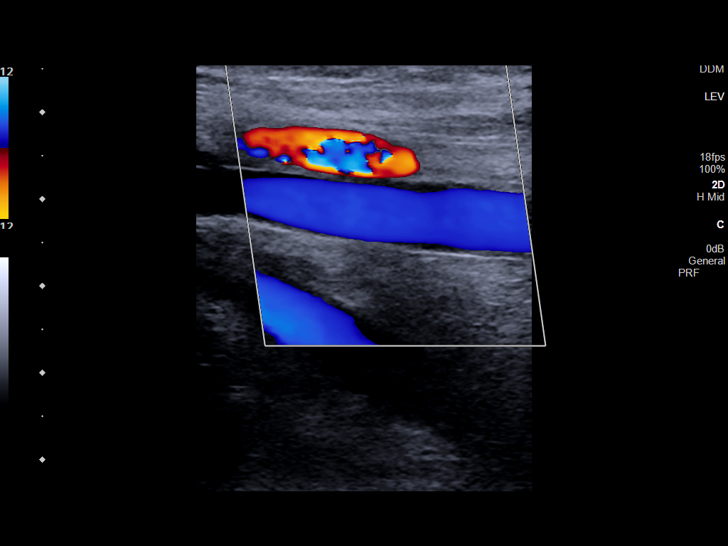
[im 18/34]
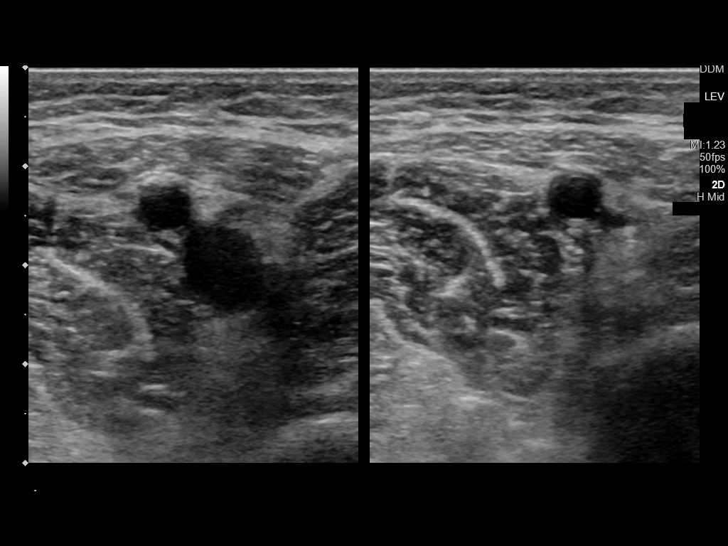
[im 21/34]
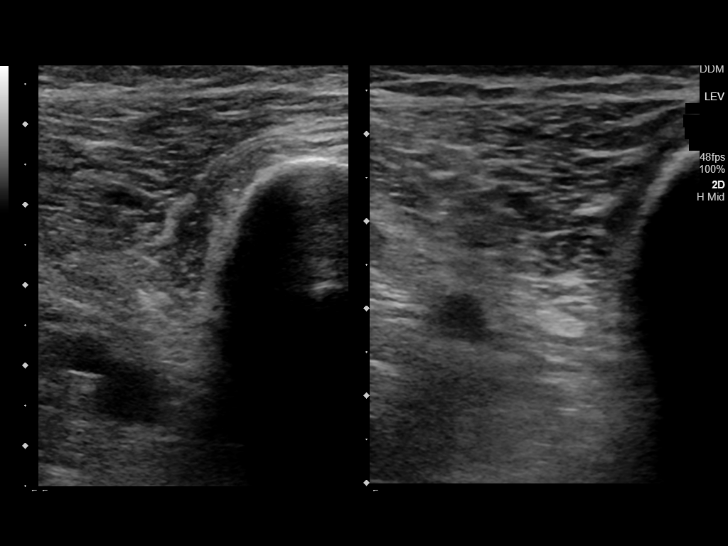
[im 23/34]
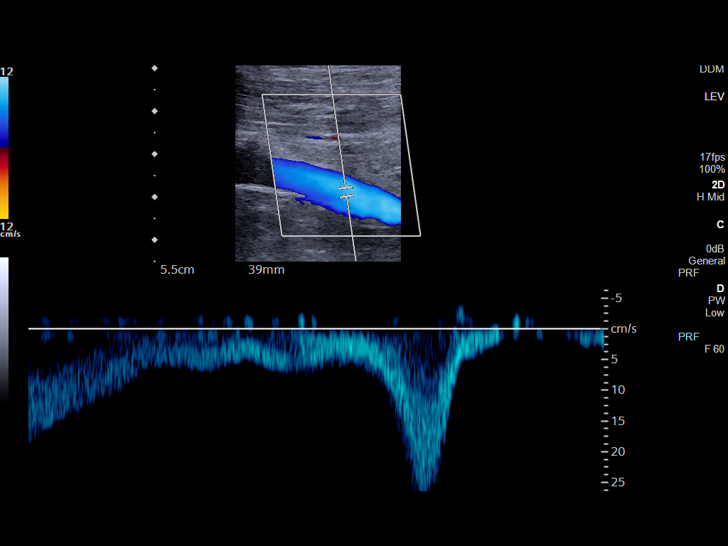
[im 26/34]
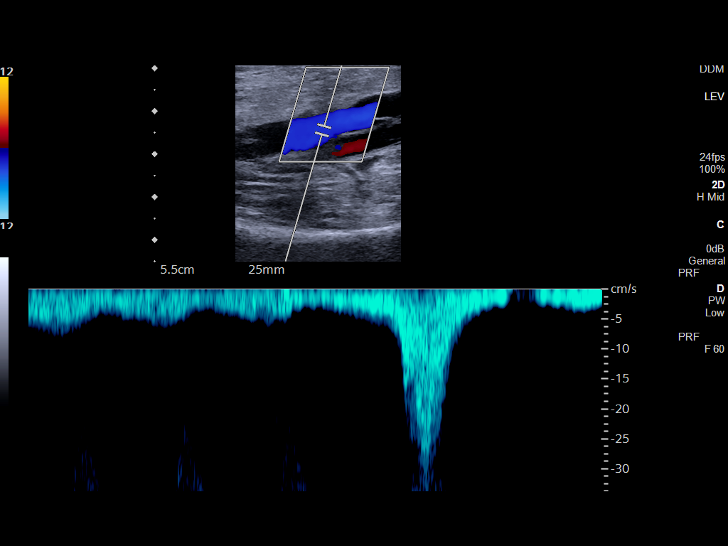
[im 28/34]
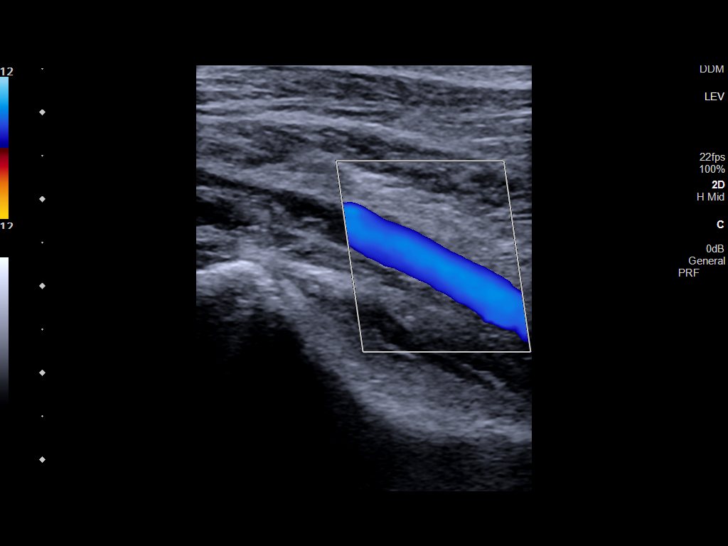
[im 31/34]
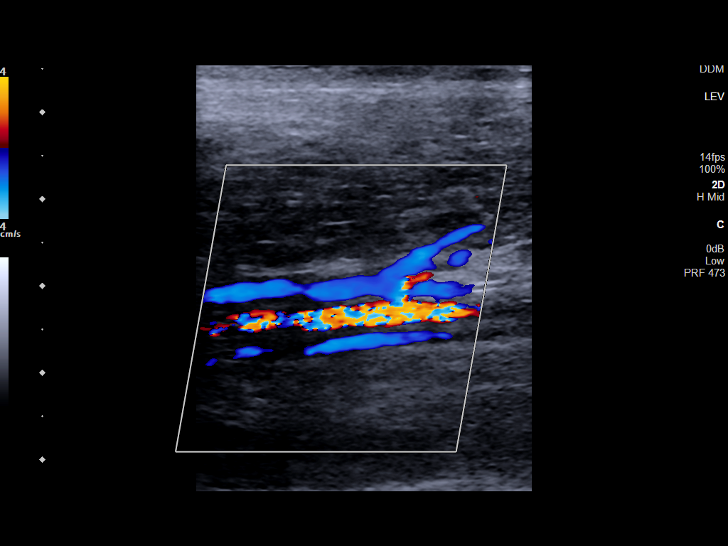
[im 34/34]
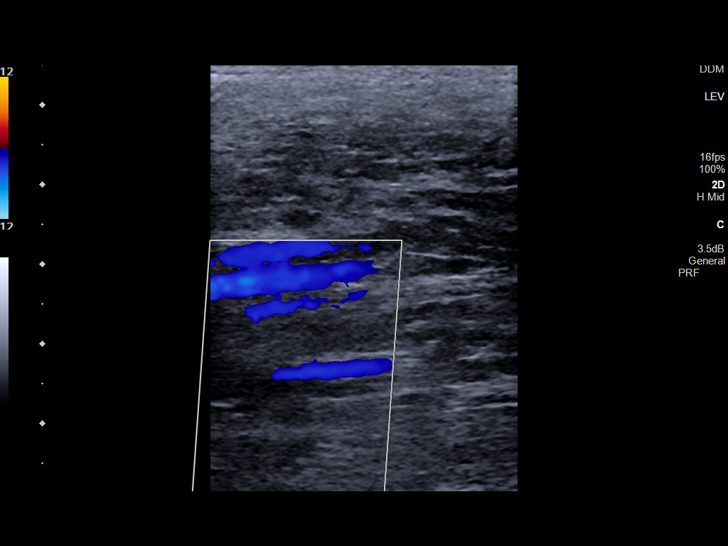

[14 of 24 positions shown; findings below may reference images not displayed]

FINDINGS: VENOUS

Normal compressibility of the common femoral, superficial femoral,
and popliteal veins, as well as the visualized calf veins.
Visualized portions of profunda femoral vein and great saphenous
vein unremarkable. No filling defects to suggest DVT on grayscale or
color Doppler imaging. Doppler waveforms show normal direction of
venous flow, normal respiratory plasticity and response to
augmentation.

Limited views of the contralateral common femoral vein are
unremarkable.

OTHER

None.

Limitations: none
IMPRESSION: No evidence of deep venous thrombosis in the LEFT lower extremity.

## 2021-04-15 ENCOUNTER — Encounter: Payer: Self-pay | Admitting: *Deleted

## 2021-04-15 ENCOUNTER — Other Ambulatory Visit: Payer: Self-pay

## 2021-04-15 ENCOUNTER — Emergency Department
Admission: EM | Admit: 2021-04-15 | Discharge: 2021-04-16 | Disposition: A | Discharge status: Home / Self Care | Payer: Medicare HMO | Attending: Emergency Medicine | Admitting: Emergency Medicine

## 2021-04-15 DIAGNOSIS — E876 Hypokalemia: Secondary | ICD-10-CM

## 2021-04-15 DIAGNOSIS — R451 Restlessness and agitation: Secondary | ICD-10-CM | POA: Diagnosis present

## 2021-04-15 DIAGNOSIS — F419 Anxiety disorder, unspecified: Secondary | ICD-10-CM | POA: Insufficient documentation

## 2021-04-15 DIAGNOSIS — Z87891 Personal history of nicotine dependence: Secondary | ICD-10-CM | POA: Diagnosis not present

## 2021-04-15 DIAGNOSIS — E860 Dehydration: Secondary | ICD-10-CM

## 2021-04-15 LAB — CBC
HCT: 34.1 % — ABNORMAL LOW (ref 39.0–52.0)
Hemoglobin: 12.1 g/dL — ABNORMAL LOW (ref 13.0–17.0)
MCH: 29 pg (ref 26.0–34.0)
MCHC: 35.5 g/dL (ref 30.0–36.0)
MCV: 81.8 fL (ref 80.0–100.0)
Platelets: 270 10*3/uL (ref 150–400)
RBC: 4.17 MIL/uL — ABNORMAL LOW (ref 4.22–5.81)
RDW: 12.7 % (ref 11.5–15.5)
WBC: 9.6 10*3/uL (ref 4.0–10.5)
nRBC: 0 % (ref 0.0–0.2)

## 2021-04-15 LAB — COMPREHENSIVE METABOLIC PANEL
ALT: 13 U/L (ref 0–44)
AST: 25 U/L (ref 15–41)
Albumin: 3.6 g/dL (ref 3.5–5.0)
Alkaline Phosphatase: 67 U/L (ref 38–126)
Anion gap: 10 (ref 5–15)
BUN: <5 mg/dL — ABNORMAL LOW (ref 6–20)
CO2: 25 mmol/L (ref 22–32)
Calcium: 8.4 mg/dL — ABNORMAL LOW (ref 8.9–10.3)
Chloride: 95 mmol/L — ABNORMAL LOW (ref 98–111)
Creatinine, Ser: 0.61 mg/dL (ref 0.61–1.24)
GFR, Estimated: >60 mL/min (ref >60)
Glucose, Bld: 113 mg/dL — ABNORMAL HIGH (ref 70–99)
Potassium: 3.1 mmol/L — ABNORMAL LOW (ref 3.5–5.1)
Sodium: 130 mmol/L — ABNORMAL LOW (ref 135–145)
Total Bilirubin: 0.8 mg/dL (ref 0.3–1.2)
Total Protein: 6 g/dL — ABNORMAL LOW (ref 6.5–8.1)

## 2021-04-15 MED ORDER — HYDROXYZINE HCL 25 MG PO TABS
50.0000 mg | ORAL_TABLET | Freq: Once | ORAL | Status: AC
  Administered 2021-04-15: 50 mg via ORAL
  Filled 2021-04-15: qty 2

## 2021-04-15 MED ORDER — SODIUM CHLORIDE 0.9 % IV BOLUS
1000.0000 mL | Freq: Once | INTRAVENOUS | Status: AC
  Administered 2021-04-15: 1000 mL via INTRAVENOUS

## 2021-04-15 MED ORDER — POTASSIUM CHLORIDE CRYS ER 20 MEQ PO TBCR
40.0000 meq | EXTENDED_RELEASE_TABLET | Freq: Once | ORAL | Status: AC
  Administered 2021-04-15: 40 meq via ORAL
  Filled 2021-04-15: qty 2

## 2021-04-15 MED ORDER — HYDROXYZINE HCL 25 MG PO TABS: 25.0000 mg | ORAL_TABLET | Freq: Three times a day (TID) | ORAL | 0 refills | Status: DC | PRN

## 2021-04-15 MED ORDER — LORAZEPAM 2 MG/ML IJ SOLN
1.0000 mg | Freq: Once | INTRAMUSCULAR | Status: AC
  Administered 2021-04-15: 1 mg via INTRAVENOUS
  Filled 2021-04-15: qty 1

## 2021-04-15 MED ORDER — POTASSIUM CHLORIDE CRYS ER 10 MEQ PO TBCR: 10.0000 meq | EXTENDED_RELEASE_TABLET | Freq: Two times a day (BID) | ORAL | 0 refills | Status: DC

## 2021-04-15 NOTE — ED Triage Notes (Signed)
ptwith increased tremors over last 24 hours. Per ems pt with conversion syndrome. Pt states he hurts all the time. Pt in no acute distress.

## 2021-04-15 NOTE — ED Notes (Signed)
Patient states he has been off Fentanyl for two weeks due to "not working for my neuropathy pain."

## 2021-04-15 NOTE — ED Notes (Signed)
Dr. Lenard Lance at bedside prior to triage start.

## 2021-04-15 NOTE — ED Notes (Signed)
Patient is resting on stretcher. Legs are not moving constantly. Patient denies any relief after Ativan administration.
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
## 2021-04-15 NOTE — Discharge Instructions (Addendum)
Please drink plenty of fluids and increase your food intake.  Please take your medications as prescribed.  Return to the emergency department for any symptoms personally concerning to yourself.  Please follow-up with your doctor in 2 to 3 days for recheck/reevaluation.

## 2021-04-16 NOTE — ED Notes (Signed)
Patient was found with IV removed and discharge papers at bedside. Patient states he was calling for ride.

## 2021-05-01 ENCOUNTER — Other Ambulatory Visit: Payer: Self-pay

## 2021-05-01 ENCOUNTER — Inpatient Hospital Stay
Admission: EM | Admit: 2021-05-01 | Discharge: 2021-05-04 | DRG: 193 | Disposition: A | Discharge status: Home / Self Care | Payer: Medicare HMO | Attending: Internal Medicine | Admitting: Internal Medicine

## 2021-05-01 ENCOUNTER — Observation Stay: Payer: Medicare HMO

## 2021-05-01 ENCOUNTER — Emergency Department: Payer: Medicare HMO

## 2021-05-01 DIAGNOSIS — T3695XA Adverse effect of unspecified systemic antibiotic, initial encounter: Secondary | ICD-10-CM | POA: Diagnosis not present

## 2021-05-01 DIAGNOSIS — G8929 Other chronic pain: Secondary | ICD-10-CM | POA: Diagnosis present

## 2021-05-01 DIAGNOSIS — J40 Bronchitis, not specified as acute or chronic: Secondary | ICD-10-CM

## 2021-05-01 DIAGNOSIS — G894 Chronic pain syndrome: Secondary | ICD-10-CM

## 2021-05-01 DIAGNOSIS — R7303 Prediabetes: Secondary | ICD-10-CM | POA: Diagnosis present

## 2021-05-01 DIAGNOSIS — Z87820 Personal history of traumatic brain injury: Secondary | ICD-10-CM

## 2021-05-01 DIAGNOSIS — Z888 Allergy status to other drugs, medicaments and biological substances status: Secondary | ICD-10-CM

## 2021-05-01 DIAGNOSIS — T380X5A Adverse effect of glucocorticoids and synthetic analogues, initial encounter: Secondary | ICD-10-CM | POA: Diagnosis not present

## 2021-05-01 DIAGNOSIS — Z9181 History of falling: Secondary | ICD-10-CM

## 2021-05-01 DIAGNOSIS — E871 Hypo-osmolality and hyponatremia: Secondary | ICD-10-CM | POA: Diagnosis present

## 2021-05-01 DIAGNOSIS — Z79899 Other long term (current) drug therapy: Secondary | ICD-10-CM

## 2021-05-01 DIAGNOSIS — F32A Depression, unspecified: Secondary | ICD-10-CM | POA: Diagnosis not present

## 2021-05-01 DIAGNOSIS — I499 Cardiac arrhythmia, unspecified: Secondary | ICD-10-CM | POA: Diagnosis present

## 2021-05-01 DIAGNOSIS — J9601 Acute respiratory failure with hypoxia: Secondary | ICD-10-CM | POA: Diagnosis present

## 2021-05-01 DIAGNOSIS — Z7401 Bed confinement status: Secondary | ICD-10-CM

## 2021-05-01 DIAGNOSIS — J189 Pneumonia, unspecified organism: Principal | ICD-10-CM | POA: Diagnosis present

## 2021-05-01 DIAGNOSIS — F449 Dissociative and conversion disorder, unspecified: Secondary | ICD-10-CM | POA: Diagnosis present

## 2021-05-01 DIAGNOSIS — J209 Acute bronchitis, unspecified: Secondary | ICD-10-CM | POA: Diagnosis present

## 2021-05-01 DIAGNOSIS — Z87891 Personal history of nicotine dependence: Secondary | ICD-10-CM

## 2021-05-01 DIAGNOSIS — Z8249 Family history of ischemic heart disease and other diseases of the circulatory system: Secondary | ICD-10-CM

## 2021-05-01 DIAGNOSIS — Z681 Body mass index (BMI) 19 or less, adult: Secondary | ICD-10-CM

## 2021-05-01 DIAGNOSIS — J9600 Acute respiratory failure, unspecified whether with hypoxia or hypercapnia: Secondary | ICD-10-CM | POA: Diagnosis not present

## 2021-05-01 DIAGNOSIS — K219 Gastro-esophageal reflux disease without esophagitis: Secondary | ICD-10-CM | POA: Diagnosis present

## 2021-05-01 DIAGNOSIS — Z20822 Contact with and (suspected) exposure to covid-19: Secondary | ICD-10-CM | POA: Diagnosis present

## 2021-05-01 DIAGNOSIS — G629 Polyneuropathy, unspecified: Secondary | ICD-10-CM | POA: Diagnosis present

## 2021-05-01 DIAGNOSIS — Z79891 Long term (current) use of opiate analgesic: Secondary | ICD-10-CM

## 2021-05-01 DIAGNOSIS — F4310 Post-traumatic stress disorder, unspecified: Secondary | ICD-10-CM | POA: Diagnosis present

## 2021-05-01 DIAGNOSIS — R197 Diarrhea, unspecified: Secondary | ICD-10-CM | POA: Diagnosis not present

## 2021-05-01 DIAGNOSIS — E43 Unspecified severe protein-calorie malnutrition: Secondary | ICD-10-CM | POA: Diagnosis present

## 2021-05-01 DIAGNOSIS — Z791 Long term (current) use of non-steroidal anti-inflammatories (NSAID): Secondary | ICD-10-CM

## 2021-05-01 DIAGNOSIS — R739 Hyperglycemia, unspecified: Secondary | ICD-10-CM | POA: Diagnosis not present

## 2021-05-01 DIAGNOSIS — F419 Anxiety disorder, unspecified: Secondary | ICD-10-CM | POA: Diagnosis present

## 2021-05-01 DIAGNOSIS — Z881 Allergy status to other antibiotic agents status: Secondary | ICD-10-CM

## 2021-05-01 HISTORY — DX: Depression, unspecified: F32.A

## 2021-05-01 LAB — CBC WITH DIFFERENTIAL/PLATELET
Abs Immature Granulocytes: 0.05 10*3/uL (ref 0.00–0.07)
Basophils Absolute: 0 10*3/uL (ref 0.0–0.1)
Basophils Relative: 0 %
Eosinophils Absolute: 0 10*3/uL (ref 0.0–0.5)
Eosinophils Relative: 0 %
HCT: 43.4 % (ref 39.0–52.0)
Hemoglobin: 14.2 g/dL (ref 13.0–17.0)
Immature Granulocytes: 1 %
Lymphocytes Relative: 7 %
Lymphs Abs: 0.8 10*3/uL (ref 0.7–4.0)
MCH: 28.9 pg (ref 26.0–34.0)
MCHC: 32.7 g/dL (ref 30.0–36.0)
MCV: 88.2 fL (ref 80.0–100.0)
Monocytes Absolute: 0.6 10*3/uL (ref 0.1–1.0)
Monocytes Relative: 5 %
Neutro Abs: 9.2 10*3/uL — ABNORMAL HIGH (ref 1.7–7.7)
Neutrophils Relative %: 87 %
Platelets: 310 10*3/uL (ref 150–400)
RBC: 4.92 MIL/uL (ref 4.22–5.81)
RDW: 13.8 % (ref 11.5–15.5)
WBC: 10.6 10*3/uL — ABNORMAL HIGH (ref 4.0–10.5)
nRBC: 0 % (ref 0.0–0.2)

## 2021-05-01 LAB — COMPREHENSIVE METABOLIC PANEL
ALT: 13 U/L (ref 0–44)
AST: 37 U/L (ref 15–41)
Albumin: 4.1 g/dL (ref 3.5–5.0)
Alkaline Phosphatase: 85 U/L (ref 38–126)
Anion gap: 9 (ref 5–15)
BUN: 10 mg/dL (ref 6–20)
CO2: 29 mmol/L (ref 22–32)
Calcium: 9.1 mg/dL (ref 8.9–10.3)
Chloride: 96 mmol/L — ABNORMAL LOW (ref 98–111)
Creatinine, Ser: 0.55 mg/dL — ABNORMAL LOW (ref 0.61–1.24)
GFR, Estimated: >60 mL/min (ref >60)
Glucose, Bld: 117 mg/dL — ABNORMAL HIGH (ref 70–99)
Potassium: 4.3 mmol/L (ref 3.5–5.1)
Sodium: 134 mmol/L — ABNORMAL LOW (ref 135–145)
Total Bilirubin: 0.7 mg/dL (ref 0.3–1.2)
Total Protein: 7.6 g/dL (ref 6.5–8.1)

## 2021-05-01 LAB — RESP PANEL BY RT-PCR (FLU A&B, COVID) ARPGX2
Influenza A by PCR: NEGATIVE
Influenza B by PCR: NEGATIVE
SARS Coronavirus 2 by RT PCR: NEGATIVE

## 2021-05-01 LAB — TROPONIN I (HIGH SENSITIVITY)
Troponin I (High Sensitivity): 4 ng/L (ref <18)
Troponin I (High Sensitivity): 3 ng/L (ref <18)

## 2021-05-01 LAB — D-DIMER, QUANTITATIVE: D-Dimer, Quant: 0.49 ug/mL-FEU (ref 0.00–0.50)

## 2021-05-01 LAB — ETHANOL: Alcohol, Ethyl (B): <10 mg/dL (ref <10)

## 2021-05-01 MED ORDER — PANTOPRAZOLE SODIUM 40 MG PO TBEC
40.0000 mg | DELAYED_RELEASE_TABLET | Freq: Every day | ORAL | Status: DC
  Administered 2021-05-02 – 2021-05-04 (×3): 40 mg via ORAL
  Filled 2021-05-01 (×3): qty 1

## 2021-05-01 MED ORDER — DICLOFENAC SODIUM 75 MG PO TBEC
75.0000 mg | DELAYED_RELEASE_TABLET | Freq: Every day | ORAL | Status: DC
  Administered 2021-05-02 – 2021-05-04 (×3): 75 mg via ORAL
  Filled 2021-05-01 (×3): qty 1

## 2021-05-01 MED ORDER — FLUTICASONE PROPIONATE 50 MCG/ACT NA SUSP
1.0000 | Freq: Every day | NASAL | Status: DC
  Administered 2021-05-02 – 2021-05-04 (×3): 1 via NASAL
  Filled 2021-05-01: qty 16

## 2021-05-01 MED ORDER — HYDROXYZINE HCL 25 MG PO TABS: 25.0000 mg | ORAL_TABLET | Freq: Three times a day (TID) | ORAL | Status: DC | PRN

## 2021-05-01 MED ORDER — SODIUM CHLORIDE 0.9 % IV BOLUS
1000.0000 mL | Freq: Once | INTRAVENOUS | Status: AC
  Administered 2021-05-01: 1000 mL via INTRAVENOUS

## 2021-05-01 MED ORDER — IPRATROPIUM-ALBUTEROL 0.5-2.5 (3) MG/3ML IN SOLN: 3.0000 mL | Freq: Once | RESPIRATORY_TRACT | Status: AC

## 2021-05-01 MED ORDER — TAPENTADOL HCL ER 100 MG PO TB12: 100.0000 mg | ORAL_TABLET | Freq: Two times a day (BID) | ORAL | Status: DC

## 2021-05-01 MED ORDER — SODIUM CHLORIDE 0.9% FLUSH: 3.0000 mL | INTRAVENOUS | Status: DC | PRN

## 2021-05-01 MED ORDER — IPRATROPIUM-ALBUTEROL 0.5-2.5 (3) MG/3ML IN SOLN
3.0000 mL | Freq: Four times a day (QID) | RESPIRATORY_TRACT | Status: DC | PRN
  Administered 2021-05-01 – 2021-05-02 (×2): 3 mL via RESPIRATORY_TRACT
  Filled 2021-05-01: qty 3

## 2021-05-01 MED ORDER — SODIUM CHLORIDE 0.9 % IV SOLN
250.0000 mL | INTRAVENOUS | Status: DC | PRN
Start: 2021-05-01 — End: 2021-05-04

## 2021-05-01 MED ORDER — FLUOXETINE HCL 20 MG PO CAPS
60.0000 mg | ORAL_CAPSULE | Freq: Every day | ORAL | Status: DC
  Administered 2021-05-02 – 2021-05-04 (×3): 60 mg via ORAL
  Filled 2021-05-01 (×3): qty 3

## 2021-05-01 MED ORDER — FINASTERIDE 5 MG PO TABS
5.0000 mg | ORAL_TABLET | Freq: Every day | ORAL | Status: DC
  Administered 2021-05-02 – 2021-05-04 (×3): 5 mg via ORAL
  Filled 2021-05-01 (×3): qty 1

## 2021-05-01 MED ORDER — OXYCODONE HCL 5 MG PO TABS
10.0000 mg | ORAL_TABLET | Freq: Four times a day (QID) | ORAL | Status: DC
  Administered 2021-05-01 – 2021-05-04 (×11): 10 mg via ORAL
  Filled 2021-05-01 (×11): qty 2

## 2021-05-01 MED ORDER — POTASSIUM CHLORIDE CRYS ER 20 MEQ PO TBCR
10.0000 meq | EXTENDED_RELEASE_TABLET | Freq: Two times a day (BID) | ORAL | Status: DC
  Administered 2021-05-02 – 2021-05-04 (×5): 10 meq via ORAL
  Filled 2021-05-01 (×5): qty 1

## 2021-05-01 MED ORDER — METHYLPREDNISOLONE SODIUM SUCC 125 MG IJ SOLR
125.0000 mg | Freq: Once | INTRAMUSCULAR | Status: AC
  Administered 2021-05-01: 125 mg via INTRAVENOUS
  Filled 2021-05-01: qty 2

## 2021-05-01 MED ORDER — PREDNISONE 20 MG PO TABS: 40.0000 mg | ORAL_TABLET | Freq: Every day | ORAL | Status: DC

## 2021-05-01 MED ORDER — SODIUM CHLORIDE 0.9% FLUSH
3.0000 mL | Freq: Two times a day (BID) | INTRAVENOUS | Status: DC
  Administered 2021-05-01 – 2021-05-03 (×4): 3 mL via INTRAVENOUS

## 2021-05-01 MED ORDER — SODIUM CHLORIDE 0.9 % IV SOLN
1.0000 g | INTRAVENOUS | Status: DC
  Administered 2021-05-01 – 2021-05-03 (×3): 1 g via INTRAVENOUS
  Filled 2021-05-01: qty 1
  Filled 2021-05-01 (×2): qty 10
  Filled 2021-05-01: qty 1
  Filled 2021-05-01: qty 10

## 2021-05-01 MED ORDER — FENTANYL 75 MCG/HR TD PT72: 1.0000 | MEDICATED_PATCH | TRANSDERMAL | Status: DC

## 2021-05-01 MED ORDER — SODIUM CHLORIDE 0.9 % IV SOLN
500.0000 mg | INTRAVENOUS | Status: DC
  Administered 2021-05-01 – 2021-05-03 (×3): 500 mg via INTRAVENOUS
  Filled 2021-05-01 (×5): qty 500

## 2021-05-01 MED ORDER — IPRATROPIUM-ALBUTEROL 0.5-2.5 (3) MG/3ML IN SOLN
3.0000 mL | Freq: Once | RESPIRATORY_TRACT | Status: AC
  Administered 2021-05-01: 3 mL via RESPIRATORY_TRACT

## 2021-05-01 MED ORDER — TRAZODONE HCL 50 MG PO TABS
50.0000 mg | ORAL_TABLET | Freq: Every day | ORAL | Status: DC
  Administered 2021-05-01 – 2021-05-03 (×3): 50 mg via ORAL
  Filled 2021-05-01 (×3): qty 1

## 2021-05-01 MED ORDER — ONDANSETRON HCL 4 MG/2ML IJ SOLN: 4.0000 mg | Freq: Four times a day (QID) | INTRAMUSCULAR | Status: DC | PRN

## 2021-05-01 MED ORDER — ONDANSETRON HCL 4 MG PO TABS: 4.0000 mg | ORAL_TABLET | Freq: Four times a day (QID) | ORAL | Status: DC | PRN

## 2021-05-01 MED ORDER — FENTANYL 12 MCG/HR TD PT72: 1.0000 | MEDICATED_PATCH | TRANSDERMAL | Status: DC

## 2021-05-01 MED ORDER — CYCLOBENZAPRINE HCL 10 MG PO TABS
10.0000 mg | ORAL_TABLET | Freq: Three times a day (TID) | ORAL | Status: DC | PRN
  Administered 2021-05-02: 10 mg via ORAL
  Filled 2021-05-01: qty 1

## 2021-05-01 MED ORDER — PREGABALIN 75 MG PO CAPS
150.0000 mg | ORAL_CAPSULE | Freq: Two times a day (BID) | ORAL | Status: DC
  Administered 2021-05-01 – 2021-05-04 (×6): 150 mg via ORAL
  Filled 2021-05-01 (×6): qty 2

## 2021-05-01 MED ORDER — METHYLPREDNISOLONE SODIUM SUCC 40 MG IJ SOLR
40.0000 mg | Freq: Two times a day (BID) | INTRAMUSCULAR | Status: AC
  Administered 2021-05-01 – 2021-05-02 (×2): 40 mg via INTRAVENOUS
  Filled 2021-05-01 (×2): qty 1

## 2021-05-01 MED ORDER — ARIPIPRAZOLE 2 MG PO TABS
2.0000 mg | ORAL_TABLET | Freq: Every day | ORAL | Status: DC
  Administered 2021-05-02 – 2021-05-04 (×3): 2 mg via ORAL
  Filled 2021-05-01 (×3): qty 1

## 2021-05-01 MED ORDER — ENOXAPARIN SODIUM 40 MG/0.4ML IJ SOSY
40.0000 mg | PREFILLED_SYRINGE | INTRAMUSCULAR | Status: DC
  Administered 2021-05-01: 40 mg via SUBCUTANEOUS
  Filled 2021-05-01 (×3): qty 0.4

## 2021-05-01 MED ORDER — OXYCODONE HCL 5 MG PO TABS: 15.0000 mg | ORAL_TABLET | Freq: Every day | ORAL | Status: DC

## 2021-05-01 MED ORDER — IPRATROPIUM-ALBUTEROL 0.5-2.5 (3) MG/3ML IN SOLN
3.0000 mL | Freq: Once | RESPIRATORY_TRACT | Status: AC
  Filled 2021-05-01: qty 3

## 2021-05-01 MED ORDER — TAMSULOSIN HCL 0.4 MG PO CAPS
0.4000 mg | ORAL_CAPSULE | Freq: Every day | ORAL | Status: DC
  Administered 2021-05-02 – 2021-05-04 (×3): 0.4 mg via ORAL
  Filled 2021-05-01 (×3): qty 1

## 2021-05-01 MED ORDER — METOPROLOL TARTRATE 25 MG PO TABS
12.5000 mg | ORAL_TABLET | Freq: Every day | ORAL | Status: DC
  Filled 2021-05-01 (×2): qty 1

## 2021-05-01 NOTE — ED Triage Notes (Signed)
Pt arrives via EMS from home after having not felt good x2 days- pt has not been eating well- pt cbg was 181 per EMS- pt denies n/v/d

## 2021-05-01 NOTE — ED Notes (Signed)
Pt has home fentanyl patch on
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
## 2021-05-01 NOTE — ED Notes (Signed)
Pt desat to 85%- pt placed on 2L [place]
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
## 2021-05-01 NOTE — ED Notes (Signed)
Pt taken for xray

## 2021-05-01 NOTE — ED Notes (Addendum)
Per daughter Shanda Bumps, pt has had poor appetite and intake over the last few days. Pt has hx of depression and conversion disorder, and had to put his dog down approx 1 week ago. Daughter updated with patient's permission.
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
## 2021-05-02 ENCOUNTER — Encounter: Payer: Self-pay | Admitting: Internal Medicine

## 2021-05-02 DIAGNOSIS — J9601 Acute respiratory failure with hypoxia: Secondary | ICD-10-CM | POA: Diagnosis not present

## 2021-05-02 DIAGNOSIS — J189 Pneumonia, unspecified organism: Secondary | ICD-10-CM | POA: Diagnosis not present

## 2021-05-02 LAB — CBC
HCT: 41.7 % (ref 39.0–52.0)
Hemoglobin: 13.9 g/dL (ref 13.0–17.0)
MCH: 29.2 pg (ref 26.0–34.0)
MCHC: 33.3 g/dL (ref 30.0–36.0)
MCV: 87.6 fL (ref 80.0–100.0)
Platelets: 316 10*3/uL (ref 150–400)
RBC: 4.76 MIL/uL (ref 4.22–5.81)
RDW: 13.8 % (ref 11.5–15.5)
WBC: 12.9 10*3/uL — ABNORMAL HIGH (ref 4.0–10.5)
nRBC: 0 % (ref 0.0–0.2)

## 2021-05-02 LAB — BASIC METABOLIC PANEL
Anion gap: 8 (ref 5–15)
BUN: 19 mg/dL (ref 6–20)
CO2: 28 mmol/L (ref 22–32)
Calcium: 9.5 mg/dL (ref 8.9–10.3)
Chloride: 100 mmol/L (ref 98–111)
Creatinine, Ser: 0.77 mg/dL (ref 0.61–1.24)
GFR, Estimated: >60 mL/min (ref >60)
Glucose, Bld: 238 mg/dL — ABNORMAL HIGH (ref 70–99)
Potassium: 4.6 mmol/L (ref 3.5–5.1)
Sodium: 136 mmol/L (ref 135–145)

## 2021-05-02 LAB — HEMOGLOBIN A1C
Hgb A1c MFr Bld: 5.7 % — ABNORMAL HIGH (ref 4.8–5.6)
Mean Plasma Glucose: 116.89 mg/dL

## 2021-05-02 MED ORDER — ENSURE ENLIVE PO LIQD
237.0000 mL | Freq: Three times a day (TID) | ORAL | Status: DC
  Administered 2021-05-02 – 2021-05-04 (×5): 237 mL via ORAL

## 2021-05-02 MED ORDER — ADULT MULTIVITAMIN W/MINERALS CH
1.0000 | ORAL_TABLET | Freq: Every day | ORAL | Status: DC
  Administered 2021-05-03 – 2021-05-04 (×2): 1 via ORAL
  Filled 2021-05-02 (×2): qty 1

## 2021-05-02 NOTE — Care Management Obs Status (Signed)
MEDICARE OBSERVATION STATUS NOTIFICATION   Patient Details  Name: [name] MRN: [id number] Date of Birth: 1966/07/24   Medicare Observation Status Notification Given:  Yes    Margarito Liner, LCSW 05/02/2021, 11:37 AM
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
## 2021-05-02 NOTE — Progress Notes (Addendum)
Progress Note    [name]  JXB:[id number] DOB: [birth year]  DOA: 05/01/2021 PCP: Jannifer Hick, MD      Brief Narrative:    Medical records reviewed and are as summarized below:  [name] is a 55 y.o. male with medical history significant for chronic neuropathic pain, GERD, TBI, PTSD, depression, conversion disorder, cervical radiculopathy, osteoarthritis, anxiety, who presented to the hospital because of shortness of breath, chest tightness and "not feeling good".  Oxygen saturation was 86% on room air in the emergency room.  He was found to have left upper lobe pneumonia and acute hypoxic respiratory failure.  He was treated with empiric IV antibiotics and oxygen via nasal cannula.    Assessment/Plan:   Principal Problem:   CAP (community acquired pneumonia) Active Problems:   Chronic pain   Respiratory failure, acute (HCC)   Depression   Bronchitis   Irregular heart beat   GERD (gastroesophageal reflux disease)   Body mass index is 17.72 kg/m.    Left upper lobe pneumonia: Continue empiric IV antibiotics.   Acute hypoxic respiratory failure: Continue 2 L/min oxygen via nasal cannula.  Taper off oxygen as able.  Hyperglycemia: This may be due to steroids.  Discontinue steroids.  Check hemoglobin A1c.  Monitor glucose level.  Hyponatremia: Improved.  Depression, history of PTSD: Continue psychotropics  Other comorbidities include GERD, chronic neuropathic pain   Diet Order            DIET DYS 2 Room service appropriate? Yes; Fluid consistency: Thin  Diet effective 1000                    Consultants:  None  Procedures:  None    Medications:   . ARIPiprazole  2 mg Oral Daily  . diclofenac  75 mg Oral Daily  . enoxaparin (LOVENOX) injection  40 mg Subcutaneous Q24H  . fentaNYL  1 patch Transdermal Q72H  . finasteride  5 mg Oral Daily  . FLUoxetine  60 mg Oral Daily  . fluticasone  1 spray Each Nare Daily  .  metoprolol tartrate  12.5 mg Oral Daily  . oxyCODONE  10 mg Oral QID  . pantoprazole  40 mg Oral Daily  . potassium chloride  10 mEq Oral BID  . pregabalin  150 mg Oral BID  . sodium chloride flush  3 mL Intravenous Q12H  . tamsulosin  0.4 mg Oral Daily  . traZODone  50 mg Oral QHS   Continuous Infusions: . sodium chloride    . azithromycin Stopped (05/01/21 2232)  . cefTRIAXone (ROCEPHIN)  IV Stopped (05/01/21 2107)     Anti-infectives (From admission, onward)   Start     Dose/Rate Route Frequency Ordered Stop   05/01/21 1900  cefTRIAXone (ROCEPHIN) 1 g in sodium chloride 0.9 % 100 mL IVPB        1 g 200 mL/hr over 30 Minutes Intravenous Every 24 hours 05/01/21 1814     05/01/21 1900  azithromycin (ZITHROMAX) 500 mg in sodium chloride 0.9 % 250 mL IVPB        500 mg 250 mL/hr over 60 Minutes Intravenous Every 24 hours 05/01/21 1814               Family Communication/Anticipated D/C date and plan/Code Status   DVT prophylaxis: enoxaparin (LOVENOX) injection 40 mg Start: 05/01/21 2200     Code Status: Full Code  Family Communication: None Disposition Plan:  Status is: Observation  The patient will require care spanning > 2 midnights and should be moved to inpatient because: IV treatments appropriate due to intensity of illness or inability to take PO and Inpatient level of care appropriate due to severity of illness  Dispo: The patient is from: Home              Anticipated d/c is to: Home              Patient currently is not medically stable to d/c.   Difficult to place patient No           Subjective:   C/o shortness of breath and generalized weakness  Objective:    Vitals:   05/01/21 2343 05/02/21 0408 05/02/21 0737 05/02/21 1130  BP: 90/62 95/72 120/82 109/74  Pulse: 98 87 (!) 102 95  Resp: 18 14 20    Temp: 98.2 F (36.8 C) 98.7 F (37.1 C) 98.6 F (37 C) 98.3 F (36.8 C)  TempSrc: Oral  Oral   SpO2: 94% 98% 97% 97%  Weight:       Height:       No data found.   Intake/Output Summary (Last 24 hours) at 05/02/2021 1344 Last data filed at 05/02/2021 1023 Gross per 24 hour  Intake 1070 ml  Output --  Net 1070 ml   Filed Weights   05/01/21 1157  Weight: 54.4 kg    Exam:  GEN: NAD SKIN: Warm and dry EYES: EOMI, no pallor or icterus ENT: MMM CV: RRR PULM: CTA B ABD: soft, ND, NT, +BS CNS: AAO x 3, non focal EXT: No edema or tenderness        Data Reviewed:   I have personally reviewed following labs and imaging studies:  Labs: Labs show the following:   Basic Metabolic Panel: Recent Labs  Lab 05/01/21 1210 05/02/21 0346  NA 134* 136  K 4.3 4.6  CL 96* 100  CO2 29 28  GLUCOSE 117* 238*  BUN 10 19  CREATININE 0.55* 0.77  CALCIUM 9.1 9.5   GFR Estimated Creatinine Clearance: 80.3 mL/min (by C-G formula based on SCr of 0.77 mg/dL). Liver Function Tests: Recent Labs  Lab 05/01/21 1210  AST 37  ALT 13  ALKPHOS 85  BILITOT 0.7  PROT 7.6  ALBUMIN 4.1   No results for input(s): LIPASE, AMYLASE in the last 168 hours. No results for input(s): AMMONIA in the last 168 hours. Coagulation profile No results for input(s): INR, PROTIME in the last 168 hours.  CBC: Recent Labs  Lab 05/01/21 1210 05/02/21 0346  WBC 10.6* 12.9*  NEUTROABS 9.2*  --   HGB 14.2 13.9  HCT 43.4 41.7  MCV 88.2 87.6  PLT 310 316   Cardiac Enzymes: No results for input(s): CKTOTAL, CKMB, CKMBINDEX, TROPONINI in the last 168 hours. BNP (last 3 results) No results for input(s): PROBNP in the last 8760 hours. CBG: No results for input(s): GLUCAP in the last 168 hours. D-Dimer: Recent Labs    05/01/21 1453  DDIMER 0.49   Hgb A1c: No results for input(s): HGBA1C in the last 72 hours. Lipid Profile: No results for input(s): CHOL, HDL, LDLCALC, TRIG, CHOLHDL, LDLDIRECT in the last 72 hours. Thyroid function studies: No results for input(s): TSH, T4TOTAL, T3FREE, THYROIDAB in the last 72  hours.  Invalid input(s): FREET3 Anemia work up: No results for input(s): VITAMINB12, FOLATE, FERRITIN, TIBC, IRON, RETICCTPCT in the last 72 hours. Sepsis Labs: Recent Labs  Lab 05/01/21  1210 05/02/21 0346  WBC 10.6* 12.9*    Microbiology Recent Results (from the past 240 hour(s))  Resp Panel by RT-PCR (Flu A&B, Covid) Nasopharyngeal Swab     Status: None   Collection Time: 05/01/21  1:20 PM   Specimen: Nasopharyngeal Swab; Nasopharyngeal(NP) swabs in vial transport medium  Result Value Ref Range Status   SARS Coronavirus 2 by RT PCR NEGATIVE NEGATIVE Final    Comment: (NOTE) SARS-CoV-2 target nucleic acids are NOT DETECTED.  The SARS-CoV-2 RNA is generally detectable in upper respiratory specimens during the acute phase of infection. The lowest concentration of SARS-CoV-2 viral copies this assay can detect is 138 copies/mL. A negative result does not preclude SARS-Cov-2 infection and should not be used as the sole basis for treatment or other patient management decisions. A negative result may occur with  improper specimen collection/handling, submission of specimen other than nasopharyngeal swab, presence of viral mutation(s) within the areas targeted by this assay, and inadequate number of viral copies(<138 copies/mL). A negative result must be combined with clinical observations, patient history, and epidemiological information. The expected result is Negative.  Fact Sheet for Patients:  BloggerCourse.com  Fact Sheet for Healthcare Providers:  SeriousBroker.it  This test is no t yet approved or cleared by the [place] FDA and  has been authorized for detection and/or diagnosis of SARS-CoV-2 by FDA under an Emergency Use Authorization (EUA). This EUA will remain  in effect (meaning this test can be used) for the duration of the COVID-19 declaration under Section 564(b)(1) of the Act, 21 U.S.C.section  360bbb-3(b)(1), unless the authorization is terminated  or revoked sooner.       Influenza A by PCR NEGATIVE NEGATIVE Final   Influenza B by PCR NEGATIVE NEGATIVE Final    Comment: (NOTE) The Xpert Xpress SARS-CoV-2/FLU/RSV plus assay is intended as an aid in the diagnosis of influenza from Nasopharyngeal swab specimens and should not be used as a sole basis for treatment. Nasal washings and aspirates are unacceptable for Xpert Xpress SARS-CoV-2/FLU/RSV testing.  Fact Sheet for Patients: BloggerCourse.com  Fact Sheet for Healthcare Providers: SeriousBroker.it  This test is not yet approved or cleared by the [place] FDA and has been authorized for detection and/or diagnosis of SARS-CoV-2 by FDA under an Emergency Use Authorization (EUA). This EUA will remain in effect (meaning this test can be used) for the duration of the COVID-19 declaration under Section 564(b)(1) of the Act, 21 U.S.C. section 360bbb-3(b)(1), unless the authorization is terminated or revoked.  Performed at [facility], [address] [place], [place] [address]     Procedures and diagnostic studies:  DG Chest 2 View  Result Date: 05/01/2021 CLINICAL DATA:  Two day history of generalized weakness. EXAM: CHEST - 2 VIEW COMPARISON:  01/10/2021 and earlier, including CT chest 01/08/2021. FINDINGS: Cardiomediastinal silhouette unremarkable and unchanged. Mildly prominent bronchovascular markings diffusely and moderate central peribronchial thickening, more so than on prior examinations. Lungs otherwise clear. No localized airspace consolidation. No pleural effusions. No pneumothorax. Normal pulmonary vascularity. Visualized bony thorax unremarkable apart from thoracic levoscoliosis. IMPRESSION: Mild-to-moderate changes of bronchitis and/or asthma without localized airspace pneumonia. Electronically Signed   By: Hulan Saas M.D.   On: 05/01/2021  13:18   CT CHEST WO CONTRAST  Result Date: 05/01/2021 CLINICAL DATA:  Chest tightness and shortness of breath EXAM: CT CHEST WITHOUT CONTRAST TECHNIQUE: Multidetector CT imaging of the chest was performed following the standard protocol without IV contrast. COMPARISON:  Chest radiograph May 01, 2021;  chest CT January 08, 2021 FINDINGS: Cardiovascular: No thoracic aortic aneurysm. Visualized great vessels appear unremarkable. There are several foci of coronary artery calcification. No pericardial effusion or pericardial thickening. Mediastinum/Nodes: Visualized thyroid appears unremarkable. No adenopathy appreciable in the thoracic region. No esophageal lesions. Lungs/Pleura: There is ill-defined airspace opacity in the anterior segment left upper lobe. There is a small bulla in the left lower lobe posteriorly measuring 1.0 x 1.0 cm, stable. Lungs elsewhere are clear. No pleural effusions. Trachea and major bronchial structures appear patent. No evident pneumothorax. Upper Abdomen: Visualized upper abdominal structures appear unremarkable. Musculoskeletal: No blastic or lytic bone lesions. No chest wall lesions appreciable. IMPRESSION: 1. Focus of airspace opacity in the anterior segment of the left upper lobe without consolidation. This appearance is consistent with focal pneumonia in the left upper lobe anteriorly. Atypical organism pneumonia could present in this manner. No other areas of infiltrate. 2.  Stable small bulla left lower lobe posteriorly. 3.  No pleural effusions. 4.  Foci of coronary artery calcification noted. 5.  No evident adenopathy. Electronically Signed   By: Bretta Bang III M.D.   On: 05/01/2021 17:24               LOS: 0 days   Colum Colt  [facility]   Pager on [email or web address]. If 7PM-7AM, please contact night-coverage at [email or web address]     05/02/2021, 1:44 PM

## 2021-05-02 NOTE — Progress Notes (Addendum)
1418- Patient weaned to room air. Patient ambulated 160 ft around nursing unit with rolling walker and standby assist on room air. Sats maintained 93-95% while ambulating.   [id number]- Patient called out reporting shortness of breath. O2 sat 86% on room air. O2 reapplied at 2L/m via [place]. Saturation now up to 96.
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
## 2021-05-03 DIAGNOSIS — Z20822 Contact with and (suspected) exposure to covid-19: Secondary | ICD-10-CM | POA: Diagnosis present

## 2021-05-03 DIAGNOSIS — Z681 Body mass index (BMI) 19 or less, adult: Secondary | ICD-10-CM | POA: Diagnosis not present

## 2021-05-03 DIAGNOSIS — F449 Dissociative and conversion disorder, unspecified: Secondary | ICD-10-CM | POA: Diagnosis present

## 2021-05-03 DIAGNOSIS — G8929 Other chronic pain: Secondary | ICD-10-CM | POA: Diagnosis present

## 2021-05-03 DIAGNOSIS — G629 Polyneuropathy, unspecified: Secondary | ICD-10-CM | POA: Diagnosis present

## 2021-05-03 DIAGNOSIS — J9601 Acute respiratory failure with hypoxia: Secondary | ICD-10-CM | POA: Diagnosis present

## 2021-05-03 DIAGNOSIS — R7303 Prediabetes: Secondary | ICD-10-CM | POA: Diagnosis present

## 2021-05-03 DIAGNOSIS — Z9181 History of falling: Secondary | ICD-10-CM | POA: Diagnosis not present

## 2021-05-03 DIAGNOSIS — T3695XA Adverse effect of unspecified systemic antibiotic, initial encounter: Secondary | ICD-10-CM | POA: Diagnosis not present

## 2021-05-03 DIAGNOSIS — K219 Gastro-esophageal reflux disease without esophagitis: Secondary | ICD-10-CM | POA: Diagnosis present

## 2021-05-03 DIAGNOSIS — J189 Pneumonia, unspecified organism: Secondary | ICD-10-CM | POA: Diagnosis present

## 2021-05-03 DIAGNOSIS — Z87820 Personal history of traumatic brain injury: Secondary | ICD-10-CM | POA: Diagnosis not present

## 2021-05-03 DIAGNOSIS — Z888 Allergy status to other drugs, medicaments and biological substances status: Secondary | ICD-10-CM | POA: Diagnosis not present

## 2021-05-03 DIAGNOSIS — F32A Depression, unspecified: Secondary | ICD-10-CM | POA: Diagnosis present

## 2021-05-03 DIAGNOSIS — Z881 Allergy status to other antibiotic agents status: Secondary | ICD-10-CM | POA: Diagnosis not present

## 2021-05-03 DIAGNOSIS — G894 Chronic pain syndrome: Secondary | ICD-10-CM | POA: Diagnosis not present

## 2021-05-03 DIAGNOSIS — F419 Anxiety disorder, unspecified: Secondary | ICD-10-CM | POA: Diagnosis present

## 2021-05-03 DIAGNOSIS — Z79899 Other long term (current) drug therapy: Secondary | ICD-10-CM | POA: Diagnosis not present

## 2021-05-03 DIAGNOSIS — Z7401 Bed confinement status: Secondary | ICD-10-CM | POA: Diagnosis not present

## 2021-05-03 DIAGNOSIS — R197 Diarrhea, unspecified: Secondary | ICD-10-CM | POA: Diagnosis not present

## 2021-05-03 DIAGNOSIS — F4310 Post-traumatic stress disorder, unspecified: Secondary | ICD-10-CM | POA: Diagnosis present

## 2021-05-03 DIAGNOSIS — E43 Unspecified severe protein-calorie malnutrition: Secondary | ICD-10-CM | POA: Diagnosis present

## 2021-05-03 DIAGNOSIS — Z791 Long term (current) use of non-steroidal anti-inflammatories (NSAID): Secondary | ICD-10-CM | POA: Diagnosis not present

## 2021-05-03 DIAGNOSIS — I499 Cardiac arrhythmia, unspecified: Secondary | ICD-10-CM | POA: Diagnosis present

## 2021-05-03 DIAGNOSIS — E871 Hypo-osmolality and hyponatremia: Secondary | ICD-10-CM | POA: Diagnosis present

## 2021-05-03 LAB — URINALYSIS, COMPLETE (UACMP) WITH MICROSCOPIC
Bacteria, UA: NONE SEEN
Bilirubin Urine: NEGATIVE
Glucose, UA: 50 mg/dL — AB
Hgb urine dipstick: NEGATIVE
Ketones, ur: NEGATIVE mg/dL
Leukocytes,Ua: NEGATIVE
Nitrite: NEGATIVE
Protein, ur: NEGATIVE mg/dL
Specific Gravity, Urine: 1.014 (ref 1.005–1.030)
pH: 5 (ref 5.0–8.0)

## 2021-05-03 NOTE — Progress Notes (Addendum)
Mobility Specialist - Progress Note   05/03/21 1700  Mobility  Activity Ambulated in hall  Level of Assistance Independent  Assistive Device None  Distance Ambulated (ft) 200 ft  Mobility Ambulated independently in hallway  Mobility Response Tolerated well  Mobility performed by Mobility specialist  $Mobility charge 1 Mobility    Pre-mobility: 105 HR, 95% SpO2 During mobility: 103 HR, 94% SpO2 Post-mobility: 91 HR, 96% SPO2   Pt ambulated in hallway without AD. No LOB. Mild dizziness upon standing. Mild SOB on RA, O2 maintained >/= 92% during activity. Pt verbalizes having neuropathy in LE. Reports still feeling a little "fuzzy-headed" once returned supine. RN notified of performance.    [name] Mobility Specialist 05/03/21, 6:01 PM

## 2021-05-03 NOTE — Progress Notes (Signed)
Progress Note    [name]  JJO:[id number] DOB: 1966-03-12  DOA: 05/01/2021 PCP: Chad Hick, MD      Brief Narrative:    Medical records reviewed and are as summarized below:  [name] is a 55 y.o. male with medical history significant for chronic neuropathic pain, GERD, TBI, PTSD, depression, conversion disorder, cervical radiculopathy, osteoarthritis, anxiety, who presented to the hospital because of shortness of breath, chest tightness and "not feeling good".  Oxygen saturation was 86% on room air in the emergency room.  He was found to have left upper lobe pneumonia and acute hypoxic respiratory failure.  He was treated with empiric IV antibiotics and oxygen via nasal cannula.    Assessment/Plan:   Principal Problem:   CAP (community acquired pneumonia) Active Problems:   Chronic pain   Respiratory failure, acute (HCC)   Depression   Bronchitis   Irregular heart beat   GERD (gastroesophageal reflux disease)   Protein-calorie malnutrition, severe   Community acquired pneumonia   Body mass index is 17.72 kg/m.    Left upper lobe pneumonia: Continue IV antibiotics.  Acute hypoxic respiratory failure: Improved.  He is tolerating room air.    Prediabetes, hyperglycemia: Hemoglobin A1c was 5.7.  He was educated about prediabetes.   Diarrhea: Probably due to antibiotics.  Continue to monitor.  Encouraged adequate oral hydration.  Hyponatremia: Improved.  Depression, history of PTSD: Continue psychotropics  Other comorbidities include GERD, chronic neuropathic pain  Possible discharge to home tomorrow.   Diet Order            DIET DYS 3 Room service appropriate? Yes; Fluid consistency: Thin  Diet effective 1400                    Consultants:  None  Procedures:  None    Medications:   . ARIPiprazole  2 mg Oral Daily  . diclofenac  75 mg Oral Daily  . enoxaparin (LOVENOX) injection  40 mg Subcutaneous Q24H  .  feeding supplement  237 mL Oral TID BM  . fentaNYL  1 patch Transdermal Q72H  . finasteride  5 mg Oral Daily  . FLUoxetine  60 mg Oral Daily  . fluticasone  1 spray Each Nare Daily  . metoprolol tartrate  12.5 mg Oral Daily  . multivitamin with minerals  1 tablet Oral Daily  . oxyCODONE  10 mg Oral QID  . pantoprazole  40 mg Oral Daily  . potassium chloride  10 mEq Oral BID  . pregabalin  150 mg Oral BID  . sodium chloride flush  3 mL Intravenous Q12H  . tamsulosin  0.4 mg Oral Daily  . traZODone  50 mg Oral QHS   Continuous Infusions: . sodium chloride    . azithromycin Stopped (05/02/21 2206)  . cefTRIAXone (ROCEPHIN)  IV Stopped (05/02/21 2033)     Anti-infectives (From admission, onward)   Start     Dose/Rate Route Frequency Ordered Stop   05/01/21 1900  cefTRIAXone (ROCEPHIN) 1 g in sodium chloride 0.9 % 100 mL IVPB        1 g 200 mL/hr over 30 Minutes Intravenous Every 24 hours 05/01/21 1814     05/01/21 1900  azithromycin (ZITHROMAX) 500 mg in sodium chloride 0.9 % 250 mL IVPB        500 mg 250 mL/hr over 60 Minutes Intravenous Every 24 hours 05/01/21 1814  Family Communication/Anticipated D/C date and plan/Code Status   DVT prophylaxis: enoxaparin (LOVENOX) injection 40 mg Start: 05/01/21 2200     Code Status: Full Code  Family Communication: None Disposition Plan:    Status is: Inpatient  Remains inpatient appropriate because:IV treatments appropriate due to intensity of illness or inability to take PO   Dispo: The patient is from: Home              Anticipated d/c is to: Home              Patient currently is not medically stable to d/c.   Difficult to place patient No             Subjective:   C/o shortness of breath, generalized weakness, diarrhea and crampy abdominal pain.  He has had 2 watery stools this morning.  No vomiting.  Objective:    Vitals:   05/03/21 0006 05/03/21 0424 05/03/21 0911 05/03/21 1121   BP: 109/71 (!) 148/93 131/89 124/88  Pulse: 82 91 94 100  Resp: 16 12 20 18   Temp: 97.8 F (36.6 C) 97.9 F (36.6 C) 98.3 F (36.8 C) 98.3 F (36.8 C)  TempSrc: Oral Oral  Oral  SpO2: 99% 98% 98% 96%  Weight:      Height:       No data found.   Intake/Output Summary (Last 24 hours) at 05/03/2021 1325 Last data filed at 05/03/2021 1012 Gross per 24 hour  Intake 1324.07 ml  Output --  Net 1324.07 ml   Filed Weights   05/01/21 1157  Weight: 54.4 kg    Exam:  GEN: NAD SKIN: No rash EYES: EOMI ENT: MMM CV: RRR PULM: CTA B ABD: soft, ND, NT, +BS CNS: AAO x 3, non focal EXT: No edema or tenderness           Data Reviewed:   I have personally reviewed following labs and imaging studies:  Labs: Labs show the following:   Basic Metabolic Panel: Recent Labs  Lab 05/01/21 1210 05/02/21 0346  NA 134* 136  K 4.3 4.6  CL 96* 100  CO2 29 28  GLUCOSE 117* 238*  BUN 10 19  CREATININE 0.55* 0.77  CALCIUM 9.1 9.5   GFR Estimated Creatinine Clearance: 80.3 mL/min (by C-G formula based on SCr of 0.77 mg/dL). Liver Function Tests: Recent Labs  Lab 05/01/21 1210  AST 37  ALT 13  ALKPHOS 85  BILITOT 0.7  PROT 7.6  ALBUMIN 4.1   No results for input(s): LIPASE, AMYLASE in the last 168 hours. No results for input(s): AMMONIA in the last 168 hours. Coagulation profile No results for input(s): INR, PROTIME in the last 168 hours.  CBC: Recent Labs  Lab 05/01/21 1210 05/02/21 0346  WBC 10.6* 12.9*  NEUTROABS 9.2*  --   HGB 14.2 13.9  HCT 43.4 41.7  MCV 88.2 87.6  PLT 310 316   Cardiac Enzymes: No results for input(s): CKTOTAL, CKMB, CKMBINDEX, TROPONINI in the last 168 hours. BNP (last 3 results) No results for input(s): PROBNP in the last 8760 hours. CBG: No results for input(s): GLUCAP in the last 168 hours. D-Dimer: Recent Labs    05/01/21 1453  DDIMER 0.49   Hgb A1c: Recent Labs    05/02/21 0346  HGBA1C 5.7*   Lipid  Profile: No results for input(s): CHOL, HDL, LDLCALC, TRIG, CHOLHDL, LDLDIRECT in the last 72 hours. Thyroid function studies: No results for input(s): TSH, T4TOTAL, T3FREE, THYROIDAB in the last  72 hours.  Invalid input(s): FREET3 Anemia work up: No results for input(s): VITAMINB12, FOLATE, FERRITIN, TIBC, IRON, RETICCTPCT in the last 72 hours. Sepsis Labs: Recent Labs  Lab 05/01/21 1210 05/02/21 0346  WBC 10.6* 12.9*    Microbiology Recent Results (from the past 240 hour(s))  Resp Panel by RT-PCR (Flu A&B, Covid) Nasopharyngeal Swab     Status: None   Collection Time: 05/01/21  1:20 PM   Specimen: Nasopharyngeal Swab; Nasopharyngeal(NP) swabs in vial transport medium  Result Value Ref Range Status   SARS Coronavirus 2 by RT PCR NEGATIVE NEGATIVE Final    Comment: (NOTE) SARS-CoV-2 target nucleic acids are NOT DETECTED.  The SARS-CoV-2 RNA is generally detectable in upper respiratory specimens during the acute phase of infection. The lowest concentration of SARS-CoV-2 viral copies this assay can detect is 138 copies/mL. A negative result does not preclude SARS-Cov-2 infection and should not be used as the sole basis for treatment or other patient management decisions. A negative result may occur with  improper specimen collection/handling, submission of specimen other than nasopharyngeal swab, presence of viral mutation(s) within the areas targeted by this assay, and inadequate number of viral copies(<138 copies/mL). A negative result must be combined with clinical observations, patient history, and epidemiological information. The expected result is Negative.  Fact Sheet for Patients:  BloggerCourse.com  Fact Sheet for Healthcare Providers:  SeriousBroker.it  This test is no t yet approved or cleared by the [place] FDA and  has been authorized for detection and/or diagnosis of SARS-CoV-2 by FDA under an Emergency  Use Authorization (EUA). This EUA will remain  in effect (meaning this test can be used) for the duration of the COVID-19 declaration under Section 564(b)(1) of the Act, 21 U.S.C.section 360bbb-3(b)(1), unless the authorization is terminated  or revoked sooner.       Influenza A by PCR NEGATIVE NEGATIVE Final   Influenza B by PCR NEGATIVE NEGATIVE Final    Comment: (NOTE) The Xpert Xpress SARS-CoV-2/FLU/RSV plus assay is intended as an aid in the diagnosis of influenza from Nasopharyngeal swab specimens and should not be used as a sole basis for treatment. Nasal washings and aspirates are unacceptable for Xpert Xpress SARS-CoV-2/FLU/RSV testing.  Fact Sheet for Patients: BloggerCourse.com  Fact Sheet for Healthcare Providers: SeriousBroker.it  This test is not yet approved or cleared by the [place] FDA and has been authorized for detection and/or diagnosis of SARS-CoV-2 by FDA under an Emergency Use Authorization (EUA). This EUA will remain in effect (meaning this test can be used) for the duration of the COVID-19 declaration under Section 564(b)(1) of the Act, 21 U.S.C. section 360bbb-3(b)(1), unless the authorization is terminated or revoked.  Performed at [facility], [address] [place], [place] [address]     Procedures and diagnostic studies:  CT CHEST WO CONTRAST  Result Date: 05/01/2021 CLINICAL DATA:  Chest tightness and shortness of breath EXAM: CT CHEST WITHOUT CONTRAST TECHNIQUE: Multidetector CT imaging of the chest was performed following the standard protocol without IV contrast. COMPARISON:  Chest radiograph May 01, 2021; chest CT January 08, 2021 FINDINGS: Cardiovascular: No thoracic aortic aneurysm. Visualized great vessels appear unremarkable. There are several foci of coronary artery calcification. No pericardial effusion or pericardial thickening. Mediastinum/Nodes: Visualized thyroid  appears unremarkable. No adenopathy appreciable in the thoracic region. No esophageal lesions. Lungs/Pleura: There is ill-defined airspace opacity in the anterior segment left upper lobe. There is a small bulla in the left lower lobe posteriorly measuring 1.0 x  1.0 cm, stable. Lungs elsewhere are clear. No pleural effusions. Trachea and major bronchial structures appear patent. No evident pneumothorax. Upper Abdomen: Visualized upper abdominal structures appear unremarkable. Musculoskeletal: No blastic or lytic bone lesions. No chest wall lesions appreciable. IMPRESSION: 1. Focus of airspace opacity in the anterior segment of the left upper lobe without consolidation. This appearance is consistent with focal pneumonia in the left upper lobe anteriorly. Atypical organism pneumonia could present in this manner. No other areas of infiltrate. 2.  Stable small bulla left lower lobe posteriorly. 3.  No pleural effusions. 4.  Foci of coronary artery calcification noted. 5.  No evident adenopathy. Electronically Signed   By: Bretta BangWilliam  Woodruff III M.D.   On: 05/01/2021 17:24               LOS: 0 days   Callaway Hardigree  [facility]   Pager on [email or web address]. If 7PM-7AM, please contact night-coverage at [email or web address]     05/03/2021, 1:25 PM

## 2021-05-04 DIAGNOSIS — R7303 Prediabetes: Secondary | ICD-10-CM

## 2021-05-04 LAB — BASIC METABOLIC PANEL
Anion gap: 8 (ref 5–15)
BUN: 18 mg/dL (ref 6–20)
CO2: 30 mmol/L (ref 22–32)
Calcium: 8.9 mg/dL (ref 8.9–10.3)
Chloride: 102 mmol/L (ref 98–111)
Creatinine, Ser: 0.72 mg/dL (ref 0.61–1.24)
GFR, Estimated: >60 mL/min (ref >60)
Glucose, Bld: 98 mg/dL (ref 70–99)
Potassium: 3.9 mmol/L (ref 3.5–5.1)
Sodium: 140 mmol/L (ref 135–145)

## 2021-05-04 LAB — CBC WITH DIFFERENTIAL/PLATELET
Abs Immature Granulocytes: 0.02 10*3/uL (ref 0.00–0.07)
Basophils Absolute: 0 10*3/uL (ref 0.0–0.1)
Basophils Relative: 0 %
Eosinophils Absolute: 0.2 10*3/uL (ref 0.0–0.5)
Eosinophils Relative: 2 %
HCT: 38.6 % — ABNORMAL LOW (ref 39.0–52.0)
Hemoglobin: 12.9 g/dL — ABNORMAL LOW (ref 13.0–17.0)
Immature Granulocytes: 0 %
Lymphocytes Relative: 18 %
Lymphs Abs: 1.6 10*3/uL (ref 0.7–4.0)
MCH: 29.7 pg (ref 26.0–34.0)
MCHC: 33.4 g/dL (ref 30.0–36.0)
MCV: 88.9 fL (ref 80.0–100.0)
Monocytes Absolute: 0.9 10*3/uL (ref 0.1–1.0)
Monocytes Relative: 10 %
Neutro Abs: 6 10*3/uL (ref 1.7–7.7)
Neutrophils Relative %: 70 %
Platelets: 245 10*3/uL (ref 150–400)
RBC: 4.34 MIL/uL (ref 4.22–5.81)
RDW: 15 % (ref 11.5–15.5)
WBC: 8.7 10*3/uL (ref 4.0–10.5)
nRBC: 0 % (ref 0.0–0.2)

## 2021-05-04 MED ORDER — AZITHROMYCIN 250 MG PO TABS: 250.0000 mg | ORAL_TABLET | Freq: Every day | ORAL | 0 refills | Status: AC

## 2021-05-04 NOTE — Discharge Summary (Signed)
Physician Discharge Summary  RHYLAN KAGEL XTG:[id number] DOB: [birth year] DOA: 05/01/2021  PCP: Lalla Brothers, MD  Admit date: 05/01/2021 Discharge date: 05/04/2021  Admitted From: home  Disposition:  Home   Recommendations for Outpatient Follow-up:  1. Follow up with PCP in 1-2 weeks 2. Will need repeat HbA1c in 3 months as pt is pre-DM w/ HbA1c 5.7  Home Health: no  Equipment/Devices:   Discharge Condition: stable  CODE STATUS: full  Diet recommendation:  Carb Modified   Brief/Interim Summary: HPI was taken from Dr. Francine Graven: Jacky Kindle is a 55 y.o. male with medical history significant forchronic central neuropathic pain, history of GERD, history of TBI, PTSD who presents to the ER for evaluation of " not feeling good" for about 2 days.  Patient states that he has had chest tightness and shortness of breath for the last 2 days.  He lives alone and tells me he is bedbound due to increased risk for falls.  Patient has not been eating or drinking like he should because he is unable to get around but states that his mother checks on him as often as she can and brings him meals to eat. He complains of feeling cold but denies having any fever or chills, no abdominal pain, no nausea, no vomiting, no diarrhea, no urinary symptoms, no headache, no dizziness, no lightheadedness, no focal deficits.  He denies having any cough, no lower extremity swelling, no orthopnea or paroxysmal nocturnal dyspnea. He was noted to have room air pulse oximetry of 85% and was placed on 2 L of oxygen with improvement in his pulse oximetry. Patient was admitted to the hospital in January for similar presentation of hypoxia but at that time had an abnormal CT scan which showed patchy groundglass and consolidative opacities within the lower lobes bilaterally as well as large areas of consolidation predominantly within the right upper lobe, left upper lobe and right middle lobe.  Patient was treated with  antibiotics, hypoxia resolved and he was discharged home.  Labs show sodium 134, potassium 4.3, chloride 96, bicarb 29, glucose 117, BUN 10, creatinine 0.55, calcium 9.9, alkaline phosphatase 85, albumin 4.1, AST 37, ALT 13, total protein 7.6, troponin 4, white count 10.6, hemoglobin 14.2, hematocrit 43.4, MCV 88.2, RDW 13.8, platelet count 310, D-dimer 0.49, Respiratory viral panel is negative. Chest x-ray reviewed by me show mild-to-moderate changes of bronchitis and/or asthma without localized airspace pneumonia. Twelve-lead EKG reviewed by me shows sinus rhythm     ED Course: Patient is a 55 year old Caucasian male who presents to the ER via EMS for evaluation of not feeling well.  He complains of a 2-day history of chest tightness and shortness of breath but denies having any cough.  He complains of chills but has no fever. Chest x-ray shows findings suggestive of mild to moderate bronchitis. Patient was hypoxic with room air pulse oximetry of 85% and is on 2 L of oxygen. He will be referred to observation status for further evaluation.   Hospital course from Dr. Jimmye Norman 05/04/21: Pt was being treated for left upper lobe pneumonia w/ azithromycin, ceftriaxone. Pt was d/c w/ po azithromycin to finish the course at home. Pt did require supplemental oxygen while inpatient it was able to be weaned off prior to d/c. Of note, pt was found to have prediabetes w/ HbA1c of 5.7 and received education on this. For more information, please see previous progress notes.   Discharge Diagnoses:  Principal Problem:   CAP (community acquired  pneumonia) Active Problems:   Chronic pain   Respiratory failure, acute (HCC)   Depression   Bronchitis   Irregular heart beat   GERD (gastroesophageal reflux disease)   Protein-calorie malnutrition, severe   Community acquired pneumonia  Left upper lobe pneumonia:  continue on IV azithromycin, ceftriaxone, bronchodilators and encourage incentive spirometry    Acute hypoxic respiratory failure: resolved   Pre-DM: Hemoglobin A1c was 5.7.  He was educated about prediabetes.   Diarrhea: likely due to antibiotics.  Continue to monitor.  Encouraged adequate oral hydration.  Hyponatremia: resolved   Depression: severity unknown. Hx of PTSD. Continue on home dose of abilify, fluoxetine   Chronic neuropathic pain: continue on home dose of pregabalin   Discharge Instructions  Discharge Instructions    Diet - low sodium heart healthy   Complete by: As directed    Diet Carb Modified   Complete by: As directed    Discharge instructions   Complete by: As directed    F/u w/ PCP in 1-2 weeks. Will need repeat HbA1c in 3 months as pt is pre-diabetic w/ HbA1c of 5.7   Increase activity slowly   Complete by: As directed      Allergies as of 05/04/2021      Reactions   Haloperidol Shortness Of Breath   Reaction occurred at [facility] 10/15/2019   Erythromycin    Childhood Unknown reaction   Tapentadol Other (See Comments)   Nightmares   Topiramate    Mood changes   Imitrex [sumatriptan] Nausea Only, Palpitations      Medication List    STOP taking these medications   meloxicam 15 MG tablet Commonly known as: MOBIC     TAKE these medications   ARIPiprazole 2 MG tablet Commonly known as: ABILIFY Take 2 mg by mouth daily.   azithromycin 250 MG tablet Commonly known as: Zithromax Z-Pak Take 1 tablet (250 mg total) by mouth daily for 2 days.   cholecalciferol 25 MCG (1000 UNIT) tablet Commonly known as: VITAMIN D3 Take 1,000 Units by mouth daily.   cyanocobalamin 1000 MCG tablet Take 2,000 mcg by mouth daily.   cyclobenzaprine 10 MG tablet Commonly known as: FLEXERIL Take 10 mg by mouth 3 (three) times daily as needed.   diclofenac 75 MG EC tablet Commonly known as: VOLTAREN Take 75 mg by mouth daily. Occasionally only takes once daily   fentaNYL 50 MCG/HR Commonly known as: [place] 1 patch onto the skin every 3  (three) days.   fentaNYL 12 MCG/HR Commonly known as: [place] 1 patch onto the skin every 3 (three) days.   fentaNYL 75 MCG/HR Commonly known as: DURAGESIC 1 patch every 3 (three) days.   finasteride 5 MG tablet Commonly known as: PROSCAR Take 5 mg by mouth daily.   FLUoxetine 20 MG capsule Commonly known as: PROZAC Take 3 capsules (60 mg) my mouth daily.   fluticasone 50 MCG/ACT nasal spray Commonly known as: FLONASE Place 1 spray into the nose daily.   hydrOXYzine 25 MG tablet Commonly known as: ATARAX/VISTARIL Take 1 tablet (25 mg total) by mouth 3 (three) times daily as needed for anxiety.   ibuprofen 200 MG tablet Commonly known as: ADVIL Take 400 mg by mouth every 6 (six) hours as needed for mild pain.   metoprolol tartrate 25 MG tablet Commonly known as: LOPRESSOR Take 12.5 mg by mouth daily.   naloxone 4 MG/0.1ML Liqd nasal spray kit Commonly known as: NARCAN Place into the nose.   Nucynta  ER 100 MG 12 hr tablet Generic drug: tapentadol Take 100 mg by mouth 2 (two) times daily.   omeprazole 20 MG capsule Commonly known as: PRILOSEC Take 20 mg by mouth daily.   oxyCODONE 15 MG immediate release tablet Commonly known as: ROXICODONE Take 15 mg by mouth 5 (five) times daily. 4-5x/daily   potassium chloride 10 MEQ tablet Commonly known as: KLOR-CON Take 1 tablet (10 mEq total) by mouth 2 (two) times daily.   pregabalin 150 MG capsule Commonly known as: LYRICA Take 150 mg by mouth 2 (two) times daily.   tamsulosin 0.4 MG Caps capsule Commonly known as: FLOMAX Take 0.4 mg by mouth daily.   tiZANidine 4 MG tablet Commonly known as: ZANAFLEX Take 4 mg by mouth 3 (three) times daily.   traZODone 50 MG tablet Commonly known as: DESYREL Take 50 mg by mouth at bedtime.       Allergies  Allergen Reactions  . Haloperidol Shortness Of Breath    Reaction occurred at [facility] 10/15/2019  . Erythromycin     Childhood Unknown reaction  .  Tapentadol Other (See Comments)    Nightmares  . Topiramate     Mood changes  . Imitrex [Sumatriptan] Nausea Only and Palpitations    Consultations:     Procedures/Studies: DG Chest 2 View  Result Date: 05/01/2021 CLINICAL DATA:  Two day history of generalized weakness. EXAM: CHEST - 2 VIEW COMPARISON:  01/10/2021 and earlier, including CT chest 01/08/2021. FINDINGS: Cardiomediastinal silhouette unremarkable and unchanged. Mildly prominent bronchovascular markings diffusely and moderate central peribronchial thickening, more so than on prior examinations. Lungs otherwise clear. No localized airspace consolidation. No pleural effusions. No pneumothorax. Normal pulmonary vascularity. Visualized bony thorax unremarkable apart from thoracic levoscoliosis. IMPRESSION: Mild-to-moderate changes of bronchitis and/or asthma without localized airspace pneumonia. Electronically Signed   By: Evangeline Dakin M.D.   On: 05/01/2021 13:18   CT CHEST WO CONTRAST  Result Date: 05/01/2021 CLINICAL DATA:  Chest tightness and shortness of breath EXAM: CT CHEST WITHOUT CONTRAST TECHNIQUE: Multidetector CT imaging of the chest was performed following the standard protocol without IV contrast. COMPARISON:  Chest radiograph May 01, 2021; chest CT January 08, 2021 FINDINGS: Cardiovascular: No thoracic aortic aneurysm. Visualized great vessels appear unremarkable. There are several foci of coronary artery calcification. No pericardial effusion or pericardial thickening. Mediastinum/Nodes: Visualized thyroid appears unremarkable. No adenopathy appreciable in the thoracic region. No esophageal lesions. Lungs/Pleura: There is ill-defined airspace opacity in the anterior segment left upper lobe. There is a small bulla in the left lower lobe posteriorly measuring 1.0 x 1.0 cm, stable. Lungs elsewhere are clear. No pleural effusions. Trachea and major bronchial structures appear patent. No evident pneumothorax. Upper Abdomen:  Visualized upper abdominal structures appear unremarkable. Musculoskeletal: No blastic or lytic bone lesions. No chest wall lesions appreciable. IMPRESSION: 1. Focus of airspace opacity in the anterior segment of the left upper lobe without consolidation. This appearance is consistent with focal pneumonia in the left upper lobe anteriorly. Atypical organism pneumonia could present in this manner. No other areas of infiltrate. 2.  Stable small bulla left lower lobe posteriorly. 3.  No pleural effusions. 4.  Foci of coronary artery calcification noted. 5.  No evident adenopathy. Electronically Signed   By: Lowella Grip III M.D.   On: 05/01/2021 17:24      Subjective: Pt c/o fatigue    Discharge Exam: Vitals:   05/04/21 0522 05/04/21 1108  BP: 104/73 115/83  Pulse: 81 97  Resp: 16  18  Temp: 97.7 F (36.5 C) 98.8 F (37.1 C)  SpO2: 97% 99%   Vitals:   05/03/21 1537 05/03/21 2036 05/04/21 0522 05/04/21 1108  BP: 111/75 104/67 104/73 115/83  Pulse: 91 83 81 97  Resp: $Remo'18 18 16 18  'UYySA$ Temp: 98.7 F (37.1 C) 98 F (36.7 C) 97.7 F (36.5 C) 98.8 F (37.1 C)  TempSrc: Oral Oral    SpO2: 96% 97% 97% 99%  Weight:      Height:        General: Pt is alert, awake, not in acute distress Cardiovascular: S1/S2 +, no rubs, no gallops Respiratory: diminished breath sounds b/l  Abdominal: Soft, NT, ND, bowel sounds + Extremities: no edema, no cyanosis    The results of significant diagnostics from this hospitalization (including imaging, microbiology, ancillary and laboratory) are listed below for reference.     Microbiology: Recent Results (from the past 240 hour(s))  Resp Panel by RT-PCR (Flu A&B, Covid) Nasopharyngeal Swab     Status: None   Collection Time: 05/01/21  1:20 PM   Specimen: Nasopharyngeal Swab; Nasopharyngeal(NP) swabs in vial transport medium  Result Value Ref Range Status   SARS Coronavirus 2 by RT PCR NEGATIVE NEGATIVE Final    Comment: (NOTE) SARS-CoV-2 target  nucleic acids are NOT DETECTED.  The SARS-CoV-2 RNA is generally detectable in upper respiratory specimens during the acute phase of infection. The lowest concentration of SARS-CoV-2 viral copies this assay can detect is 138 copies/mL. A negative result does not preclude SARS-Cov-2 infection and should not be used as the sole basis for treatment or other patient management decisions. A negative result may occur with  improper specimen collection/handling, submission of specimen other than nasopharyngeal swab, presence of viral mutation(s) within the areas targeted by this assay, and inadequate number of viral copies(<138 copies/mL). A negative result must be combined with clinical observations, patient history, and epidemiological information. The expected result is Negative.  Fact Sheet for Patients:  EntrepreneurPulse.com.au  Fact Sheet for Healthcare Providers:  IncredibleEmployment.be  This test is no t yet approved or cleared by the [place] FDA and  has been authorized for detection and/or diagnosis of SARS-CoV-2 by FDA under an Emergency Use Authorization (EUA). This EUA will remain  in effect (meaning this test can be used) for the duration of the COVID-19 declaration under Section 564(b)(1) of the Act, 21 U.S.C.section 360bbb-3(b)(1), unless the authorization is terminated  or revoked sooner.       Influenza A by PCR NEGATIVE NEGATIVE Final   Influenza B by PCR NEGATIVE NEGATIVE Final    Comment: (NOTE) The Xpert Xpress SARS-CoV-2/FLU/RSV plus assay is intended as an aid in the diagnosis of influenza from Nasopharyngeal swab specimens and should not be used as a sole basis for treatment. Nasal washings and aspirates are unacceptable for Xpert Xpress SARS-CoV-2/FLU/RSV testing.  Fact Sheet for Patients: EntrepreneurPulse.com.au  Fact Sheet for Healthcare  Providers: IncredibleEmployment.be  This test is not yet approved or cleared by the [place] FDA and has been authorized for detection and/or diagnosis of SARS-CoV-2 by FDA under an Emergency Use Authorization (EUA). This EUA will remain in effect (meaning this test can be used) for the duration of the COVID-19 declaration under Section 564(b)(1) of the Act, 21 U.S.C. section 360bbb-3(b)(1), unless the authorization is terminated or revoked.  Performed at [facility], [address] [place],  [address]      Labs: BNP (last 3 results) No results for input(s): BNP in  the last 8760 hours. Basic Metabolic Panel: Recent Labs  Lab 05/01/21 1210 05/02/21 0346 05/04/21 0604  NA 134* 136 140  K 4.3 4.6 3.9  CL 96* 100 102  CO2 $Re'29 28 30  'TmJ$ GLUCOSE 117* 238* 98  BUN $Re'10 19 18  'ocJ$ CREATININE 0.55* 0.77 0.72  CALCIUM 9.1 9.5 8.9   Liver Function Tests: Recent Labs  Lab 05/01/21 1210  AST 37  ALT 13  ALKPHOS 85  BILITOT 0.7  PROT 7.6  ALBUMIN 4.1   No results for input(s): LIPASE, AMYLASE in the last 168 hours. No results for input(s): AMMONIA in the last 168 hours. CBC: Recent Labs  Lab 05/01/21 1210 05/02/21 0346 05/04/21 0604  WBC 10.6* 12.9* 8.7  NEUTROABS 9.2*  --  6.0  HGB 14.2 13.9 12.9*  HCT 43.4 41.7 38.6*  MCV 88.2 87.6 88.9  PLT 310 316 245   Cardiac Enzymes: No results for input(s): CKTOTAL, CKMB, CKMBINDEX, TROPONINI in the last 168 hours. BNP: Invalid input(s): POCBNP CBG: No results for input(s): GLUCAP in the last 168 hours. D-Dimer Recent Labs    05/01/21 1453  DDIMER 0.49   Hgb A1c Recent Labs    05/02/21 0346  HGBA1C 5.7*   Lipid Profile No results for input(s): CHOL, HDL, LDLCALC, TRIG, CHOLHDL, LDLDIRECT in the last 72 hours. Thyroid function studies No results for input(s): TSH, T4TOTAL, T3FREE, THYROIDAB in the last 72 hours.  Invalid input(s): FREET3 Anemia work up No results for  input(s): VITAMINB12, FOLATE, FERRITIN, TIBC, IRON, RETICCTPCT in the last 72 hours. Urinalysis    Component Value Date/Time   COLORURINE YELLOW (A) 05/03/2021 0509   APPEARANCEUR CLEAR (A) 05/03/2021 0509   LABSPEC 1.014 05/03/2021 0509   PHURINE 5.0 05/03/2021 0509   GLUCOSEU 50 (A) 05/03/2021 0509   HGBUR NEGATIVE 05/03/2021 0509   BILIRUBINUR NEGATIVE 05/03/2021 0509   KETONESUR NEGATIVE 05/03/2021 0509   PROTEINUR NEGATIVE 05/03/2021 0509   NITRITE NEGATIVE 05/03/2021 0509   LEUKOCYTESUR NEGATIVE 05/03/2021 0509   Sepsis Labs Invalid input(s): PROCALCITONIN,  WBC,  LACTICIDVEN Microbiology Recent Results (from the past 240 hour(s))  Resp Panel by RT-PCR (Flu A&B, Covid) Nasopharyngeal Swab     Status: None   Collection Time: 05/01/21  1:20 PM   Specimen: Nasopharyngeal Swab; Nasopharyngeal(NP) swabs in vial transport medium  Result Value Ref Range Status   SARS Coronavirus 2 by RT PCR NEGATIVE NEGATIVE Final    Comment: (NOTE) SARS-CoV-2 target nucleic acids are NOT DETECTED.  The SARS-CoV-2 RNA is generally detectable in upper respiratory specimens during the acute phase of infection. The lowest concentration of SARS-CoV-2 viral copies this assay can detect is 138 copies/mL. A negative result does not preclude SARS-Cov-2 infection and should not be used as the sole basis for treatment or other patient management decisions. A negative result may occur with  improper specimen collection/handling, submission of specimen other than nasopharyngeal swab, presence of viral mutation(s) within the areas targeted by this assay, and inadequate number of viral copies(<138 copies/mL). A negative result must be combined with clinical observations, patient history, and epidemiological information. The expected result is Negative.  Fact Sheet for Patients:  EntrepreneurPulse.com.au  Fact Sheet for Healthcare Providers:   IncredibleEmployment.be  This test is no t yet approved or cleared by the [place] FDA and  has been authorized for detection and/or diagnosis of SARS-CoV-2 by FDA under an Emergency Use Authorization (EUA). This EUA will remain  in effect (meaning this test can be used) for the  duration of the COVID-19 declaration under Section 564(b)(1) of the Act, 21 U.S.C.section 360bbb-3(b)(1), unless the authorization is terminated  or revoked sooner.       Influenza A by PCR NEGATIVE NEGATIVE Final   Influenza B by PCR NEGATIVE NEGATIVE Final    Comment: (NOTE) The Xpert Xpress SARS-CoV-2/FLU/RSV plus assay is intended as an aid in the diagnosis of influenza from Nasopharyngeal swab specimens and should not be used as a sole basis for treatment. Nasal washings and aspirates are unacceptable for Xpert Xpress SARS-CoV-2/FLU/RSV testing.  Fact Sheet for Patients: EntrepreneurPulse.com.au  Fact Sheet for Healthcare Providers: IncredibleEmployment.be  This test is not yet approved or cleared by the [place] FDA and has been authorized for detection and/or diagnosis of SARS-CoV-2 by FDA under an Emergency Use Authorization (EUA). This EUA will remain in effect (meaning this test can be used) for the duration of the COVID-19 declaration under Section 564(b)(1) of the Act, 21 U.S.C. section 360bbb-3(b)(1), unless the authorization is terminated or revoked.  Performed at [facility], [address], [place], [place] [address]      Time coordinating discharge: Over 30 minutes  SIGNED:   Wyvonnia Dusky, MD  [facility] 05/04/2021, 11:59 AM Pager   If 7PM-7AM, please contact night-coverage

## 2021-05-04 NOTE — Progress Notes (Signed)
Mobility Specialist - Progress Note   05/04/21 1200  Mobility  Activity Ambulated in hall  Level of Assistance Independent  Assistive Device None  Distance Ambulated (ft) 200 ft  Mobility Ambulated independently in hallway  Mobility Response Tolerated well  Mobility performed by Mobility specialist  $Mobility charge 1 Mobility    Pt observed ambulating in hallway independently. No LOB. Pt reports feeling good, anticipating d/c.   Filiberto Pinks Mobility Specialist 05/04/21, 12:34 PM

## 2021-08-23 ENCOUNTER — Emergency Department
Admission: EM | Admit: 2021-08-23 | Discharge: 2021-08-23 | Disposition: A | Discharge status: Home / Self Care | Payer: Medicare HMO | Attending: Emergency Medicine | Admitting: Emergency Medicine

## 2021-08-23 ENCOUNTER — Other Ambulatory Visit: Payer: Self-pay

## 2021-08-23 DIAGNOSIS — Z87891 Personal history of nicotine dependence: Secondary | ICD-10-CM | POA: Diagnosis not present

## 2021-08-23 DIAGNOSIS — Z9114 Patient's other noncompliance with medication regimen: Secondary | ICD-10-CM | POA: Insufficient documentation

## 2021-08-23 DIAGNOSIS — F13230 Sedative, hypnotic or anxiolytic dependence with withdrawal, uncomplicated: Secondary | ICD-10-CM | POA: Insufficient documentation

## 2021-08-23 DIAGNOSIS — G4700 Insomnia, unspecified: Secondary | ICD-10-CM

## 2021-08-23 DIAGNOSIS — F13239 Sedative, hypnotic or anxiolytic dependence with withdrawal, unspecified: Secondary | ICD-10-CM

## 2021-08-23 DIAGNOSIS — R4189 Other symptoms and signs involving cognitive functions and awareness: Secondary | ICD-10-CM | POA: Diagnosis present

## 2021-08-23 DIAGNOSIS — R519 Headache, unspecified: Secondary | ICD-10-CM | POA: Diagnosis not present

## 2021-08-23 DIAGNOSIS — F13939 Sedative, hypnotic or anxiolytic use, unspecified with withdrawal, unspecified: Secondary | ICD-10-CM

## 2021-08-23 DIAGNOSIS — F331 Major depressive disorder, recurrent, moderate: Secondary | ICD-10-CM | POA: Diagnosis present

## 2021-08-23 DIAGNOSIS — Z79899 Other long term (current) drug therapy: Secondary | ICD-10-CM | POA: Diagnosis not present

## 2021-08-23 DIAGNOSIS — F22 Delusional disorders: Secondary | ICD-10-CM | POA: Insufficient documentation

## 2021-08-23 DIAGNOSIS — G8929 Other chronic pain: Secondary | ICD-10-CM | POA: Diagnosis present

## 2021-08-23 LAB — COMPREHENSIVE METABOLIC PANEL
ALT: 8 U/L (ref 0–44)
AST: 26 U/L (ref 15–41)
Albumin: 4.1 g/dL (ref 3.5–5.0)
Alkaline Phosphatase: 117 U/L (ref 38–126)
Anion gap: 11 (ref 5–15)
BUN: 5 mg/dL — ABNORMAL LOW (ref 6–20)
CO2: 30 mmol/L (ref 22–32)
Calcium: 8.9 mg/dL (ref 8.9–10.3)
Chloride: 96 mmol/L — ABNORMAL LOW (ref 98–111)
Creatinine, Ser: 0.95 mg/dL (ref 0.61–1.24)
GFR, Estimated: >60 mL/min (ref >60)
Glucose, Bld: 114 mg/dL — ABNORMAL HIGH (ref 70–99)
Potassium: 3.3 mmol/L — ABNORMAL LOW (ref 3.5–5.1)
Sodium: 137 mmol/L (ref 135–145)
Total Bilirubin: 0.8 mg/dL (ref 0.3–1.2)
Total Protein: 7.6 g/dL (ref 6.5–8.1)

## 2021-08-23 LAB — CBC WITH DIFFERENTIAL/PLATELET
Abs Immature Granulocytes: 0.03 10*3/uL (ref 0.00–0.07)
Basophils Absolute: 0 10*3/uL (ref 0.0–0.1)
Basophils Relative: 0 %
Eosinophils Absolute: 0.1 10*3/uL (ref 0.0–0.5)
Eosinophils Relative: 1 %
HCT: 44.8 % (ref 39.0–52.0)
Hemoglobin: 15.4 g/dL (ref 13.0–17.0)
Immature Granulocytes: 0 %
Lymphocytes Relative: 17 %
Lymphs Abs: 1.7 10*3/uL (ref 0.7–4.0)
MCH: 30.2 pg (ref 26.0–34.0)
MCHC: 34.4 g/dL (ref 30.0–36.0)
MCV: 87.8 fL (ref 80.0–100.0)
Monocytes Absolute: 0.7 10*3/uL (ref 0.1–1.0)
Monocytes Relative: 7 %
Neutro Abs: 7 10*3/uL (ref 1.7–7.7)
Neutrophils Relative %: 75 %
Platelets: 318 10*3/uL (ref 150–400)
RBC: 5.1 MIL/uL (ref 4.22–5.81)
RDW: 12.6 % (ref 11.5–15.5)
WBC: 9.6 10*3/uL (ref 4.0–10.5)
nRBC: 0 % (ref 0.0–0.2)

## 2021-08-23 LAB — URINE DRUG SCREEN, QUALITATIVE (ARMC ONLY)
Amphetamines, Ur Screen: NOT DETECTED
Barbiturates, Ur Screen: NOT DETECTED
Benzodiazepine, Ur Scrn: NOT DETECTED
Cannabinoid 50 Ng, Ur ~~LOC~~: NOT DETECTED
Cocaine Metabolite,Ur ~~LOC~~: NOT DETECTED
MDMA (Ecstasy)Ur Screen: NOT DETECTED
Methadone Scn, Ur: NOT DETECTED
Opiate, Ur Screen: NOT DETECTED
Phencyclidine (PCP) Ur S: NOT DETECTED
Tricyclic, Ur Screen: NOT DETECTED

## 2021-08-23 LAB — SALICYLATE LEVEL: Salicylate Lvl: <7 mg/dL — ABNORMAL LOW (ref 7.0–30.0)

## 2021-08-23 LAB — ETHANOL: Alcohol, Ethyl (B): <10 mg/dL (ref <10)

## 2021-08-23 LAB — ACETAMINOPHEN LEVEL: Acetaminophen (Tylenol), Serum: <10 ug/mL — ABNORMAL LOW (ref 10–30)

## 2021-08-23 LAB — LIPASE, BLOOD: Lipase: 37 U/L (ref 11–51)

## 2021-08-23 MED ORDER — PROCHLORPERAZINE MALEATE 10 MG PO TABS
10.0000 mg | ORAL_TABLET | Freq: Once | ORAL | Status: AC
  Administered 2021-08-23: 10 mg via ORAL
  Filled 2021-08-23: qty 1

## 2021-08-23 MED ORDER — DIPHENHYDRAMINE HCL 25 MG PO CAPS
50.0000 mg | ORAL_CAPSULE | Freq: Once | ORAL | Status: AC
  Administered 2021-08-23: 50 mg via ORAL
  Filled 2021-08-23: qty 2

## 2021-08-23 MED ORDER — NAPROXEN 500 MG PO TABS
500.0000 mg | ORAL_TABLET | Freq: Once | ORAL | Status: AC
  Administered 2021-08-23: 500 mg via ORAL
  Filled 2021-08-23: qty 1

## 2021-08-23 MED ORDER — LORAZEPAM 1 MG PO TABS
1.0000 mg | ORAL_TABLET | Freq: Once | ORAL | Status: AC
  Administered 2021-08-23: 1 mg via ORAL
  Filled 2021-08-23: qty 1

## 2021-08-23 NOTE — ED Notes (Addendum)
Dressed out:  1 green tshirt  1 pair jeans  1 pair black shoes  1 pair grey socks 1 pair headphones  1 belt  1 black wallet 2 sets of car keys  1 pair boxers 1 cell phone  2 bracelets 2 rings
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
## 2021-08-23 NOTE — Discharge Instructions (Addendum)
Please resume your medication and follow up with your mental health provider for ongoing monitoring of your symptoms.

## 2021-08-23 NOTE — ED Notes (Signed)
[name] [phone]
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
## 2021-08-23 NOTE — ED Notes (Signed)
Pt presents to ED with c/o of not being able to sleep for the past 76 hours due to pt not taking any of his current medications such as pian medication (oxycodone) and antidepressant and Ambien in 1 week. Pt sates he has just stopped taking them because "I want to be myself again".  Pt states pain "all over" including headache.   Pt strongly denies HI or SI. Pt is calm and cooperative and is A&Ox4. NAD noted.

## 2021-08-23 NOTE — ED Notes (Addendum)
Pt oxygen sats decreased to low 80s on RA. Pt woken up and oxygen sats increased to 95%. Pt placed on 2L [place] for comfort while sleeping. Primary RN and MD made aware.

## 2021-09-12 NOTE — H&P (Addendum)
CC: Painful upper front teeth  HPI: Has recently undergone extractions by general dentist. Unable to get adequate local anesthesia # 8 and 9 after multiple injections and oral sedation.   Past Medical History:  Irregular Heart Beat, Pneumonia, Sinus Issues, Snoring, Fainting Spells, Stroke, TBI, PTSD    Medications: Abilify, Proscar, Prozac, Flonase, Mobic, Narcan, Oxycodone, Klor-Con, Lyrica, Flomax, Zanaflex, Desyrel, Fentanyl patch    Allergies:     Imitrex, Erythromycin    Surgeries:   shoulder surgery, Neck, thigh surgery     Social History       Smoking:  n          Alcohol:n Drug use:n                             Exam: BMI 24. Gross decay teeth # 1 - 16. Edentulous mandible.   No purulence, edema, fluctuance, trismus. Oral cancer screening negative. Pharynx clear. No lymphadenopathy.  Cor: RRR  Lungs: Clear  Panorex: pending  Assessment: ASA 2. Non-restorable teeth  3, 4, 5, 6, 7, 8, 9, 10, 11, 12, 13, 14, 15, 16.               Plan: Extraction Teeth # 3, 4, 5, 6, 7, 8, 9, 10, 11, 12, 13, 14, 15, 16. Hospital Day surgery.

## 2021-09-15 ENCOUNTER — Other Ambulatory Visit: Payer: Self-pay

## 2021-09-15 ENCOUNTER — Encounter (HOSPITAL_COMMUNITY): Payer: Self-pay | Admitting: Oral Surgery

## 2021-09-15 NOTE — Progress Notes (Signed)
PCP - Dr. Erma Pinto Cardiologist - denies EKG - 08/24/21 Chest x-ray - 05/01/21 ECHO - 01/09/21 Cardiac Cath -  CPAP -    ERAS Protcol - n/a  COVID TEST- n/a  Anesthesia review: n/a  -------------  SDW INSTRUCTIONS:  Your procedure is scheduled on 09/16/21. Please report to [facility] Main Entrance "A" at 0915 A.M., and check in at the Admitting office. Call this number if you have problems the morning of surgery: [phone]   Remember: Do not eat or drink after midnight the night before your surgery   Medications to take morning of surgery with a sip of water include: finasteride (PROSCAR) fluticasone (FLONASE)  pregabalin (LYRICA)  If needed: cyclobenzaprine (FLEXERIL) Oxycodone HCl   As of today, STOP taking any Aspirin (unless otherwise instructed by your surgeon), meloxicam (MOBIC) Aleve, Naproxen, Ibuprofen, Motrin, Advil, Goody's, BC's, all herbal medications, fish oil, and all vitamins.    The Morning of Surgery Do not wear jewelry Do not wear lotions, powders, colognes, or deodorant  Men may shave face and neck. Do not bring valuables to the hospital. [facility] is not responsible for any belongings or valuables.  If you are a smoker, DO NOT Smoke 24 hours prior to surgery  If you wear a CPAP at night please bring your mask the morning of surgery   Remember that you must have someone to transport you home after your surgery, and remain with you for 24 hours if you are discharged the same day.  Please bring cases for contacts, glasses, hearing aids, dentures or bridgework because it cannot be worn into surgery.   Patients discharged the day of surgery will not be allowed to drive home.   Please shower the NIGHT BEFORE/MORNING OF SURGERY (use antibacterial soap like DIAL soap if possible). Wear comfortable clothes the morning of surgery. Oral Hygiene is also important to reduce your risk of infection.  Remember - BRUSH YOUR TEETH THE MORNING OF SURGERY WITH  YOUR REGULAR TOOTHPASTE  Patient denies shortness of breath, fever, cough and chest pain.

## 2021-09-16 ENCOUNTER — Ambulatory Visit (HOSPITAL_COMMUNITY): Payer: Medicare HMO | Admitting: Anesthesiology

## 2021-09-16 ENCOUNTER — Ambulatory Visit (HOSPITAL_COMMUNITY)
Admission: RE | Admit: 2021-09-16 | Discharge: 2021-09-16 | Disposition: A | Discharge status: Home / Self Care | Payer: Medicare HMO | Attending: Oral Surgery | Admitting: Oral Surgery

## 2021-09-16 ENCOUNTER — Encounter (HOSPITAL_COMMUNITY): Payer: Self-pay | Admitting: Oral Surgery

## 2021-09-16 ENCOUNTER — Other Ambulatory Visit: Payer: Self-pay

## 2021-09-16 ENCOUNTER — Encounter (HOSPITAL_COMMUNITY)
Admission: RE | Disposition: A | Discharge status: Home / Self Care | Payer: Self-pay | Source: Home / Self Care | Attending: Oral Surgery

## 2021-09-16 DIAGNOSIS — Z79899 Other long term (current) drug therapy: Secondary | ICD-10-CM | POA: Diagnosis not present

## 2021-09-16 DIAGNOSIS — K029 Dental caries, unspecified: Secondary | ICD-10-CM | POA: Insufficient documentation

## 2021-09-16 DIAGNOSIS — Z791 Long term (current) use of non-steroidal anti-inflammatories (NSAID): Secondary | ICD-10-CM | POA: Diagnosis not present

## 2021-09-16 DIAGNOSIS — Z881 Allergy status to other antibiotic agents status: Secondary | ICD-10-CM | POA: Insufficient documentation

## 2021-09-16 DIAGNOSIS — Z888 Allergy status to other drugs, medicaments and biological substances status: Secondary | ICD-10-CM | POA: Insufficient documentation

## 2021-09-16 DIAGNOSIS — M2679 Other specified alveolar anomalies: Secondary | ICD-10-CM | POA: Insufficient documentation

## 2021-09-16 HISTORY — PX: TOOTH EXTRACTION: SHX859

## 2021-09-16 SURGERY — DENTAL RESTORATION/EXTRACTIONS
Anesthesia: General

## 2021-09-16 MED ORDER — MIDAZOLAM HCL 2 MG/2ML IJ SOLN
INTRAMUSCULAR | Status: AC
  Filled 2021-09-16: qty 2

## 2021-09-16 MED ORDER — ONDANSETRON HCL 4 MG/2ML IJ SOLN
INTRAMUSCULAR | Status: AC
  Filled 2021-09-16: qty 2

## 2021-09-16 MED ORDER — FENTANYL CITRATE (PF) 100 MCG/2ML IJ SOLN
INTRAMUSCULAR | Status: AC
  Filled 2021-09-16: qty 2

## 2021-09-16 MED ORDER — ACETAMINOPHEN 500 MG PO TABS
ORAL_TABLET | ORAL | Status: AC
  Administered 2021-09-16: 1000 mg via ORAL
  Filled 2021-09-16: qty 2

## 2021-09-16 MED ORDER — CEFAZOLIN SODIUM-DEXTROSE 2-4 GM/100ML-% IV SOLN
2.0000 g | INTRAVENOUS | Status: AC
  Administered 2021-09-16: 2 g via INTRAVENOUS

## 2021-09-16 MED ORDER — PROPOFOL 10 MG/ML IV BOLUS
INTRAVENOUS | Status: DC | PRN
  Administered 2021-09-16: 150 mg via INTRAVENOUS
  Administered 2021-09-16: 50 mg via INTRAVENOUS

## 2021-09-16 MED ORDER — CELECOXIB 200 MG PO CAPS
ORAL_CAPSULE | ORAL | Status: AC
  Administered 2021-09-16: 200 mg via ORAL
  Filled 2021-09-16: qty 1

## 2021-09-16 MED ORDER — ONDANSETRON HCL 4 MG/2ML IJ SOLN
INTRAMUSCULAR | Status: DC | PRN
  Administered 2021-09-16: 4 mg via INTRAVENOUS

## 2021-09-16 MED ORDER — APREPITANT 40 MG PO CAPS
ORAL_CAPSULE | ORAL | Status: AC
  Administered 2021-09-16: 40 mg via ORAL
  Filled 2021-09-16: qty 1

## 2021-09-16 MED ORDER — LIDOCAINE-EPINEPHRINE 2 %-1:100000 IJ SOLN
INTRAMUSCULAR | Status: DC | PRN
  Administered 2021-09-16: 20 mL

## 2021-09-16 MED ORDER — CHLORHEXIDINE GLUCONATE 0.12 % MT SOLN: 15.0000 mL | Freq: Once | OROMUCOSAL | Status: AC

## 2021-09-16 MED ORDER — ACETAMINOPHEN 500 MG PO TABS: 1000.0000 mg | ORAL_TABLET | Freq: Once | ORAL | Status: AC

## 2021-09-16 MED ORDER — SUCCINYLCHOLINE CHLORIDE 200 MG/10ML IV SOSY
PREFILLED_SYRINGE | INTRAVENOUS | Status: AC
  Filled 2021-09-16: qty 10

## 2021-09-16 MED ORDER — LIDOCAINE-EPINEPHRINE 2 %-1:100000 IJ SOLN
INTRAMUSCULAR | Status: AC
  Filled 2021-09-16: qty 1

## 2021-09-16 MED ORDER — SODIUM CHLORIDE 0.9 % IR SOLN
Status: DC | PRN
  Administered 2021-09-16: 1000 mL

## 2021-09-16 MED ORDER — FENTANYL CITRATE (PF) 100 MCG/2ML IJ SOLN
25.0000 ug | INTRAMUSCULAR | Status: DC | PRN
  Administered 2021-09-16: 25 ug via INTRAVENOUS
  Administered 2021-09-16: 50 ug via INTRAVENOUS
  Administered 2021-09-16: 25 ug via INTRAVENOUS
  Administered 2021-09-16: 50 ug via INTRAVENOUS

## 2021-09-16 MED ORDER — ORAL CARE MOUTH RINSE: 15.0000 mL | Freq: Once | OROMUCOSAL | Status: AC

## 2021-09-16 MED ORDER — CEFAZOLIN SODIUM-DEXTROSE 2-4 GM/100ML-% IV SOLN
INTRAVENOUS | Status: AC
  Filled 2021-09-16: qty 100

## 2021-09-16 MED ORDER — OXYCODONE HCL 5 MG PO TABS: 5.0000 mg | ORAL_TABLET | Freq: Once | ORAL | Status: DC

## 2021-09-16 MED ORDER — HYDROMORPHONE HCL 1 MG/ML IJ SOLN
INTRAMUSCULAR | Status: AC
  Filled 2021-09-16: qty 1

## 2021-09-16 MED ORDER — SUCCINYLCHOLINE CHLORIDE 200 MG/10ML IV SOSY
PREFILLED_SYRINGE | INTRAVENOUS | Status: DC | PRN
  Administered 2021-09-16: 120 mg via INTRAVENOUS

## 2021-09-16 MED ORDER — DEXAMETHASONE SODIUM PHOSPHATE 10 MG/ML IJ SOLN
INTRAMUSCULAR | Status: AC
  Filled 2021-09-16: qty 1

## 2021-09-16 MED ORDER — MIDAZOLAM HCL 5 MG/5ML IJ SOLN
INTRAMUSCULAR | Status: DC | PRN
  Administered 2021-09-16: 2 mg via INTRAVENOUS

## 2021-09-16 MED ORDER — EPHEDRINE 5 MG/ML INJ
INTRAVENOUS | Status: AC
  Filled 2021-09-16: qty 5

## 2021-09-16 MED ORDER — APREPITANT 40 MG PO CAPS: 40.0000 mg | ORAL_CAPSULE | Freq: Once | ORAL | Status: AC

## 2021-09-16 MED ORDER — PROPOFOL 10 MG/ML IV BOLUS
INTRAVENOUS | Status: AC
  Filled 2021-09-16: qty 20

## 2021-09-16 MED ORDER — PHENYLEPHRINE 40 MCG/ML (10ML) SYRINGE FOR IV PUSH (FOR BLOOD PRESSURE SUPPORT)
PREFILLED_SYRINGE | INTRAVENOUS | Status: AC
  Filled 2021-09-16: qty 10

## 2021-09-16 MED ORDER — OXYMETAZOLINE HCL 0.05 % NA SOLN
NASAL | Status: AC
  Administered 2021-09-16: 2 via NASAL
  Filled 2021-09-16: qty 30

## 2021-09-16 MED ORDER — OXYMETAZOLINE HCL 0.05 % NA SOLN: 2.0000 | Freq: Once | NASAL | Status: AC

## 2021-09-16 MED ORDER — AMOXICILLIN 500 MG PO CAPS: 500.0000 mg | ORAL_CAPSULE | Freq: Three times a day (TID) | ORAL | 0 refills | Status: DC

## 2021-09-16 MED ORDER — DEXAMETHASONE SODIUM PHOSPHATE 10 MG/ML IJ SOLN
INTRAMUSCULAR | Status: DC | PRN
  Administered 2021-09-16: 10 mg via INTRAVENOUS

## 2021-09-16 MED ORDER — PHENYLEPHRINE HCL (PRESSORS) 10 MG/ML IV SOLN
INTRAVENOUS | Status: DC | PRN
  Administered 2021-09-16 (×2): 160 ug via INTRAVENOUS
  Administered 2021-09-16: 40 ug via INTRAVENOUS
  Administered 2021-09-16 (×2): 160 ug via INTRAVENOUS
  Administered 2021-09-16: 80 ug via INTRAVENOUS

## 2021-09-16 MED ORDER — OXYMETAZOLINE HCL 0.05 % NA SOLN
NASAL | Status: AC
  Filled 2021-09-16: qty 30

## 2021-09-16 MED ORDER — 0.9 % SODIUM CHLORIDE (POUR BTL) OPTIME
TOPICAL | Status: DC | PRN
  Administered 2021-09-16: 1000 mL

## 2021-09-16 MED ORDER — CHLORHEXIDINE GLUCONATE 0.12 % MT SOLN
OROMUCOSAL | Status: AC
  Administered 2021-09-16: 15 mL via OROMUCOSAL
  Filled 2021-09-16: qty 15

## 2021-09-16 MED ORDER — FENTANYL CITRATE (PF) 250 MCG/5ML IJ SOLN
INTRAMUSCULAR | Status: DC | PRN
  Administered 2021-09-16: 100 ug via INTRAVENOUS

## 2021-09-16 MED ORDER — LIDOCAINE 2% (20 MG/ML) 5 ML SYRINGE
INTRAMUSCULAR | Status: AC
  Filled 2021-09-16: qty 5

## 2021-09-16 MED ORDER — LIDOCAINE 2% (20 MG/ML) 5 ML SYRINGE
INTRAMUSCULAR | Status: DC | PRN
  Administered 2021-09-16: 100 mg via INTRAVENOUS

## 2021-09-16 MED ORDER — OXYCODONE HCL 5 MG/5ML PO SOLN
ORAL | Status: AC
  Administered 2021-09-16: 5 mg
  Filled 2021-09-16: qty 5

## 2021-09-16 MED ORDER — CELECOXIB 200 MG PO CAPS: 200.0000 mg | ORAL_CAPSULE | Freq: Once | ORAL | Status: AC

## 2021-09-16 MED ORDER — OXYCODONE-ACETAMINOPHEN 5-325 MG PO TABS: 1.0000 | ORAL_TABLET | Freq: Four times a day (QID) | ORAL | 0 refills | Status: AC | PRN

## 2021-09-16 MED ORDER — LACTATED RINGERS IV SOLN: INTRAVENOUS | Status: DC

## 2021-09-16 MED ORDER — EPHEDRINE SULFATE-NACL 50-0.9 MG/10ML-% IV SOSY
PREFILLED_SYRINGE | INTRAVENOUS | Status: DC | PRN
  Administered 2021-09-16: 10 mg via INTRAVENOUS
  Administered 2021-09-16: 30 mg via INTRAVENOUS
  Administered 2021-09-16: 10 mg via INTRAVENOUS

## 2021-09-16 MED ORDER — AMISULPRIDE (ANTIEMETIC) 5 MG/2ML IV SOLN: 10.0000 mg | Freq: Once | INTRAVENOUS | Status: DC | PRN

## 2021-09-16 MED ORDER — FENTANYL CITRATE (PF) 250 MCG/5ML IJ SOLN
INTRAMUSCULAR | Status: AC
  Filled 2021-09-16: qty 5

## 2021-09-16 MED ORDER — PROMETHAZINE HCL 25 MG/ML IJ SOLN: 6.2500 mg | INTRAMUSCULAR | Status: DC | PRN

## 2021-09-16 MED ORDER — HYDROMORPHONE HCL 1 MG/ML IJ SOLN
1.0000 mg | INTRAMUSCULAR | Status: DC | PRN
Start: 2021-09-16 — End: 2021-09-16
  Administered 2021-09-16: 1 mg via INTRAVENOUS

## 2021-09-16 SURGICAL SUPPLY — 37 items
BAG COUNTER SPONGE SURGICOUNT (BAG) IMPLANT
BAG SPNG CNTER NS LX DISP (BAG)
BLADE SURG 15 STRL LF DISP TIS (BLADE) ×1 IMPLANT
BLADE SURG 15 STRL SS (BLADE) ×2
BUR CROSS CUT FISSURE 1.6 (BURR) ×2 IMPLANT
BUR EGG ELITE 4.0 (BURR) ×2 IMPLANT
CANISTER SUCT 3000ML PPV (MISCELLANEOUS) ×2 IMPLANT
COVER SURGICAL LIGHT HANDLE (MISCELLANEOUS) ×2 IMPLANT
DECANTER SPIKE VIAL GLASS SM (MISCELLANEOUS) ×2 IMPLANT
DRAPE U-SHAPE 76X120 STRL (DRAPES) ×2 IMPLANT
GAUZE PACKING FOLDED 2  STR (GAUZE/BANDAGES/DRESSINGS) ×2
GAUZE PACKING FOLDED 2 STR (GAUZE/BANDAGES/DRESSINGS) ×1 IMPLANT
GLOVE SURG ENC MOIS LTX SZ6.5 (GLOVE) IMPLANT
GLOVE SURG ENC MOIS LTX SZ7 (GLOVE) IMPLANT
GLOVE SURG ENC MOIS LTX SZ8 (GLOVE) ×2 IMPLANT
GLOVE SURG UNDER POLY LF SZ6.5 (GLOVE) IMPLANT
GLOVE SURG UNDER POLY LF SZ7 (GLOVE) IMPLANT
GOWN STRL REUS W/ TWL LRG LVL3 (GOWN DISPOSABLE) ×1 IMPLANT
GOWN STRL REUS W/ TWL XL LVL3 (GOWN DISPOSABLE) ×1 IMPLANT
GOWN STRL REUS W/TWL LRG LVL3 (GOWN DISPOSABLE) ×2
GOWN STRL REUS W/TWL XL LVL3 (GOWN DISPOSABLE) ×2
IV NS 1000ML (IV SOLUTION) ×2
IV NS 1000ML BAXH (IV SOLUTION) ×1 IMPLANT
KIT BASIN OR (CUSTOM PROCEDURE TRAY) ×2 IMPLANT
KIT TURNOVER KIT B (KITS) ×2 IMPLANT
NDL HYPO 25GX1X1/2 BEV (NEEDLE) ×2 IMPLANT
NEEDLE HYPO 25GX1X1/2 BEV (NEEDLE) ×4 IMPLANT
NS IRRIG 1000ML POUR BTL (IV SOLUTION) ×2 IMPLANT
PAD ARMBOARD 7.5X6 YLW CONV (MISCELLANEOUS) ×2 IMPLANT
SLEEVE IRRIGATION ELITE 7 (MISCELLANEOUS) ×2 IMPLANT
SPONGE SURGIFOAM ABS GEL 12-7 (HEMOSTASIS) IMPLANT
SUT CHROMIC 3 0 PS 2 (SUTURE) ×2 IMPLANT
SYR BULB IRRIG 60ML STRL (SYRINGE) ×2 IMPLANT
SYR CONTROL 10ML LL (SYRINGE) ×2 IMPLANT
TRAY ENT MC OR (CUSTOM PROCEDURE TRAY) ×2 IMPLANT
TUBING IRRIGATION (MISCELLANEOUS) ×2 IMPLANT
YANKAUER SUCT BULB TIP NO VENT (SUCTIONS) ×2 IMPLANT

## 2021-09-16 NOTE — Anesthesia Procedure Notes (Signed)
Procedure Name: Intubation Date/Time: 09/16/2021 10:32 AM Performed by: Cy Blamer, CRNA Pre-anesthesia Checklist: Patient identified, Emergency Drugs available, Suction available and Patient being monitored Patient Re-evaluated:Patient Re-evaluated prior to induction Oxygen Delivery Method: Circle system utilized Preoxygenation: Pre-oxygenation with 100% oxygen Induction Type: IV induction Ventilation: Mask ventilation without difficulty Laryngoscope Size: 2 and Miller Grade View: Grade II Nasal Tubes: Nasal prep performed, Nasal Rae and Right Tube size: 7.5 mm Number of attempts: 1 Placement Confirmation: ETT inserted through vocal cords under direct vision, positive ETCO2 and breath sounds checked- equal and bilateral Secured at: 29 cm Tube secured with: Tape Dental Injury: Teeth and Oropharynx as per pre-operative assessment

## 2021-09-16 NOTE — H&P (Signed)
H&P documentation  -History and Physical Reviewed  -Patient has been re-examined  -No change in the plan of care  [name]  

## 2021-09-16 NOTE — Transfer of Care (Signed)
Immediate Anesthesia Transfer of Care Note  Patient: [name]  Procedure(s) Performed: DENTAL RESTORATION/EXTRACTIONS  Patient Location: PACU  Anesthesia Type:General  Level of Consciousness: awake, drowsy, patient cooperative and responds to stimulation  Airway & Oxygen Therapy: Patient connected to face mask oxygen  Post-op Assessment: Report given to RN, Post -op Vital signs reviewed and unstable, Anesthesiologist notified, Patient moving all extremities X 4 and Patient able to stick tongue midline  Post vital signs: stable  Last Vitals:  Vitals Value Taken Time  BP 155/84 09/16/21 1112  Temp 37.0   Pulse 95 09/16/21 1114  Resp 7 09/16/21 1114  SpO2 100 % 09/16/21 1114  Vitals shown include unvalidated device data.  Last Pain:  Vitals:   09/16/21 0931  TempSrc:   PainSc: 8       Patients Stated Pain Goal: 5 (09/16/21 0931)  Complications: No notable events documented.
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
## 2021-09-16 NOTE — Progress Notes (Signed)
Pacu Discharge Note  Patient instuctions were given to family. Wound care, diet, pain, follow up care and how and whom to contact with concerns were discussed. Family aware someone needs to remain with patient overnight and concerns after receiving anesthesia and what to avoid and safety. Answered all questions and concerns.   Discharge paperwork has clear contact informations for surgeon and 24 hour RN line for concerns.   Discussed what concerns to look for including infection and signs/symptoms to look for.   IV was removed prior to discharge. Patient was brought to car with belongings.  Daughter Shanda Bumps will pick up his prescription. [phone].  Pt exits my care.

## 2021-09-16 NOTE — Op Note (Signed)
NAMEGARISON, [name] MEDICAL RECORD NO: [id number] ACCOUNT NO: [id number] DATE OF BIRTH: 1966/01/26 FACILITY: [facility] LOCATION: [facility] PHYSICIAN: Georgia Lopes, DDS  Operative Report   DATE OF PROCEDURE: 09/16/2021  PREOPERATIVE DIAGNOSIS:  Nonrestorable teeth numbers 3, 4, 5, 6, 7, 8, 9, 10, 11, 12, 13, 14, 15, 16 secondary to dental caries.  POSTOPERATIVE DIAGNOSES:  Nonrestorable teeth numbers 3, 4, 5, 6, 7, 8, 9, 10, 11, 12, 13, 14, 15, 16 secondary to dental caries plus sharp alveolar bone edges with undercuts after extractions.  PROCEDURE:  Dental extraction teeth numbers 3, 4, 5, 6, 7, 8, 9, 10, 11, 12, 13, 14, 15, 16, with alveoplasty right and left maxilla.  SURGEON:  Ocie Doyne, DDS  ANESTHESIA:  General, nasal intubation.  Dr. Krista Blue attending.  DESCRIPTION OF PROCEDURE:  The patient was taken to the operating room and placed on the table in supine position.  General anesthesia was administered intravenously and a nasal endotracheal tube was placed and secured. The eyes were protected and the  patient was draped for surgery.  Timeout was performed.  A bite block was placed in the mouth.  The throat pack was placed and then local anesthesia was administered buccally and palatally in the right and left maxilla.  A 15 blade was used to make an  incision beginning at tooth #16, carried forward on the alveolar crest around tooth fragments that could be seen and it was carried across the midline to tooth #6.  The periosteum was reflected with a periosteal elevator.  Roots were identified in the  left maxilla. The Stryker handpiece with a fissure bur under irrigation was used to remove bone between teeth numbers 12, 13, 14, 15 and 16.  The teeth were then elevated and removed with the dental forceps.  Then, teeth numbers 11, 10, 9, 8 and 7 were  removed using the dental forceps and rongeurs.  The sockets were curetted.  The periosteum was reflected to expose the alveolar crest, which  had sharp edges and thin buccal bone.  The bone was trimmed and undercuts were reduced in the lateral aspect of  the superior margin of the alveolus.  Then, the area was further smoothed with a bone file, irrigated and closed with 3-0 chromic. Then the right side was operated.  A 15 blade was used to make an incision beginning at tooth #3, carried forward to tooth  #6 and the buccal and palatal sulcus were in close approximation to where the sulcus would be if the crown of the teeth were present.  Periosteum was reflected.  Bone was removed around teeth numbers 3, 4, and 5 and then the teeth were elevated and  removed from the mouth with a dental forceps.  The sockets were then curetted.  The periosteum was reflected to expose the alveolar crest, which was sharp and contained undercuts and had thin buccal bone.  The bone was smoothed with the egg bur and bone  file and then the area was irrigated and closed with 3-0 chromic.  The oral cavity was then irrigated and suctioned.  The throat pack was removed.  The patient was left under the care of anesthesia for extubation and transport to recovery room with plans  for discharge home through day surgery.  ESTIMATED BLOOD LOSS:  Minimum.  COMPLICATIONS:  None.  SPECIMENS:  None.   SHW D: 09/16/2021 11:11:10 am T: 09/16/2021 9:46:00 pm  JOB: [id number]/ [id number]

## 2021-09-16 NOTE — Anesthesia Postprocedure Evaluation (Signed)
Anesthesia Post Note  Patient: [name]  Procedure(s) Performed: DENTAL RESTORATION/EXTRACTIONS     Patient location during evaluation: PACU Anesthesia Type: General Level of consciousness: sedated Pain management: pain level controlled Vital Signs Assessment: post-procedure vital signs reviewed and stable Respiratory status: spontaneous breathing and respiratory function stable Cardiovascular status: stable Postop Assessment: no apparent nausea or vomiting Anesthetic complications: no   No notable events documented.  Last Vitals:  Vitals:   09/16/21 1430 09/16/21 1443  BP: 114/71 (!) 144/76  Pulse: 89 (!) 105  Resp: 20 (!) 31  Temp:  (!) 36.4 C  SpO2: 100% 93%    Last Pain:  Vitals:   09/16/21 1430  TempSrc:   PainSc: Asleep                 Preslyn Warr DANIEL

## 2021-09-16 NOTE — Op Note (Signed)
09/16/2021  11:04 AM  PATIENT:  [name]  55 y.o. male  PRE-OPERATIVE DIAGNOSIS:  NON-RESTORABLE TEETH # 3, 4, 5, 6, 7, 8, 9, 10, 11, `12, 13, 14, 15, 16  SECONDARY TO DENTAL CARIES  POST-OPERATIVE DIAGNOSIS:  SAME + SHARP ALVEOLAR BONE EDGES WITH UNDERCUTS  PROCEDURE:  Procedure(s): DENTAL EXTRACTION TEETH # 3, 4, 5, 6, 7, 8, 9, 10, 11, `12, 13, 14, 15, 16  WITH ALVEOLOPLASTY  SURGEON:  Surgeon(s): Ocie Doyne, DMD  ANESTHESIA:   local and general  EBL:  minimal  DRAINS: none   SPECIMEN:  No Specimen  COUNTS:  YES  PLAN OF CARE: Discharge to home after PACU  PATIENT DISPOSITION:  PACU - hemodynamically stable.   PROCEDURE DETAILS: Dictation # [id number]  Georgia Lopes, DMD 09/16/2021 11:04 AM

## 2021-09-17 ENCOUNTER — Encounter (HOSPITAL_COMMUNITY): Payer: Self-pay | Admitting: Oral Surgery

## 2021-10-30 ENCOUNTER — Other Ambulatory Visit: Payer: Self-pay

## 2021-10-30 ENCOUNTER — Emergency Department: Payer: Medicare HMO

## 2021-10-30 ENCOUNTER — Encounter: Payer: Self-pay | Admitting: Emergency Medicine

## 2021-10-30 ENCOUNTER — Emergency Department
Admission: EM | Admit: 2021-10-30 | Discharge: 2021-10-30 | Disposition: A | Discharge status: Home / Self Care | Payer: Medicare HMO | Attending: Emergency Medicine | Admitting: Emergency Medicine

## 2021-10-30 DIAGNOSIS — Z87891 Personal history of nicotine dependence: Secondary | ICD-10-CM | POA: Diagnosis not present

## 2021-10-30 DIAGNOSIS — K529 Noninfective gastroenteritis and colitis, unspecified: Secondary | ICD-10-CM | POA: Insufficient documentation

## 2021-10-30 DIAGNOSIS — R1032 Left lower quadrant pain: Secondary | ICD-10-CM | POA: Diagnosis present

## 2021-10-30 LAB — COMPREHENSIVE METABOLIC PANEL
ALT: 12 U/L (ref 0–44)
AST: 30 U/L (ref 15–41)
Albumin: 4.3 g/dL (ref 3.5–5.0)
Alkaline Phosphatase: 103 U/L (ref 38–126)
Anion gap: 7 (ref 5–15)
BUN: 7 mg/dL (ref 6–20)
CO2: 32 mmol/L (ref 22–32)
Calcium: 9 mg/dL (ref 8.9–10.3)
Chloride: 103 mmol/L (ref 98–111)
Creatinine, Ser: 1.09 mg/dL (ref 0.61–1.24)
GFR, Estimated: >60 mL/min (ref >60)
Glucose, Bld: 113 mg/dL — ABNORMAL HIGH (ref 70–99)
Potassium: 4.2 mmol/L (ref 3.5–5.1)
Sodium: 142 mmol/L (ref 135–145)
Total Bilirubin: 0.8 mg/dL (ref 0.3–1.2)
Total Protein: 7.7 g/dL (ref 6.5–8.1)

## 2021-10-30 LAB — CBC
HCT: 47.9 % (ref 39.0–52.0)
Hemoglobin: 15.5 g/dL (ref 13.0–17.0)
MCH: 28.9 pg (ref 26.0–34.0)
MCHC: 32.4 g/dL (ref 30.0–36.0)
MCV: 89.4 fL (ref 80.0–100.0)
Platelets: 232 10*3/uL (ref 150–400)
RBC: 5.36 MIL/uL (ref 4.22–5.81)
RDW: 12.8 % (ref 11.5–15.5)
WBC: 10.3 10*3/uL (ref 4.0–10.5)
nRBC: 0 % (ref 0.0–0.2)

## 2021-10-30 LAB — LIPASE, BLOOD: Lipase: 26 U/L (ref 11–51)

## 2021-10-30 MED ORDER — AMOXICILLIN-POT CLAVULANATE 875-125 MG PO TABS: 1.0000 | ORAL_TABLET | Freq: Two times a day (BID) | ORAL | 0 refills | Status: AC

## 2021-10-30 MED ORDER — AMOXICILLIN-POT CLAVULANATE 875-125 MG PO TABS: 1.0000 | ORAL_TABLET | Freq: Two times a day (BID) | ORAL | 0 refills | Status: DC

## 2021-10-30 MED ORDER — IOHEXOL 300 MG/ML  SOLN
100.0000 mL | Freq: Once | INTRAMUSCULAR | Status: AC | PRN
  Administered 2021-10-30: 100 mL via INTRAVENOUS
  Filled 2021-10-30: qty 100

## 2021-10-30 MED ORDER — MORPHINE SULFATE (PF) 4 MG/ML IV SOLN
4.0000 mg | Freq: Once | INTRAVENOUS | Status: AC
  Administered 2021-10-30: 4 mg via INTRAVENOUS
  Filled 2021-10-30: qty 1

## 2021-10-30 MED ORDER — ONDANSETRON HCL 4 MG/2ML IJ SOLN
4.0000 mg | Freq: Once | INTRAMUSCULAR | Status: AC
  Administered 2021-10-30: 4 mg via INTRAVENOUS
  Filled 2021-10-30: qty 2
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
## 2021-10-30 NOTE — ED Triage Notes (Signed)
Pt reports pain to his and for the past few weeks. Pt states had some test ran at his MD office a couple of weeks ago and was told to follow up if still continued discomfort. Pt reports pain is to his left upper and lower abd and it feels tight.
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
## 2021-11-26 IMAGING — DX DG CHEST 1V
1 series · 1 of 1 positions shown · non-contrast
Comparison: 04/07/2020

CLINICAL DATA: Two falls today. Evidence of multifocal pneumonia on
CT cervical spine.

EXAM:
CHEST  1 VIEW

[chest ap]
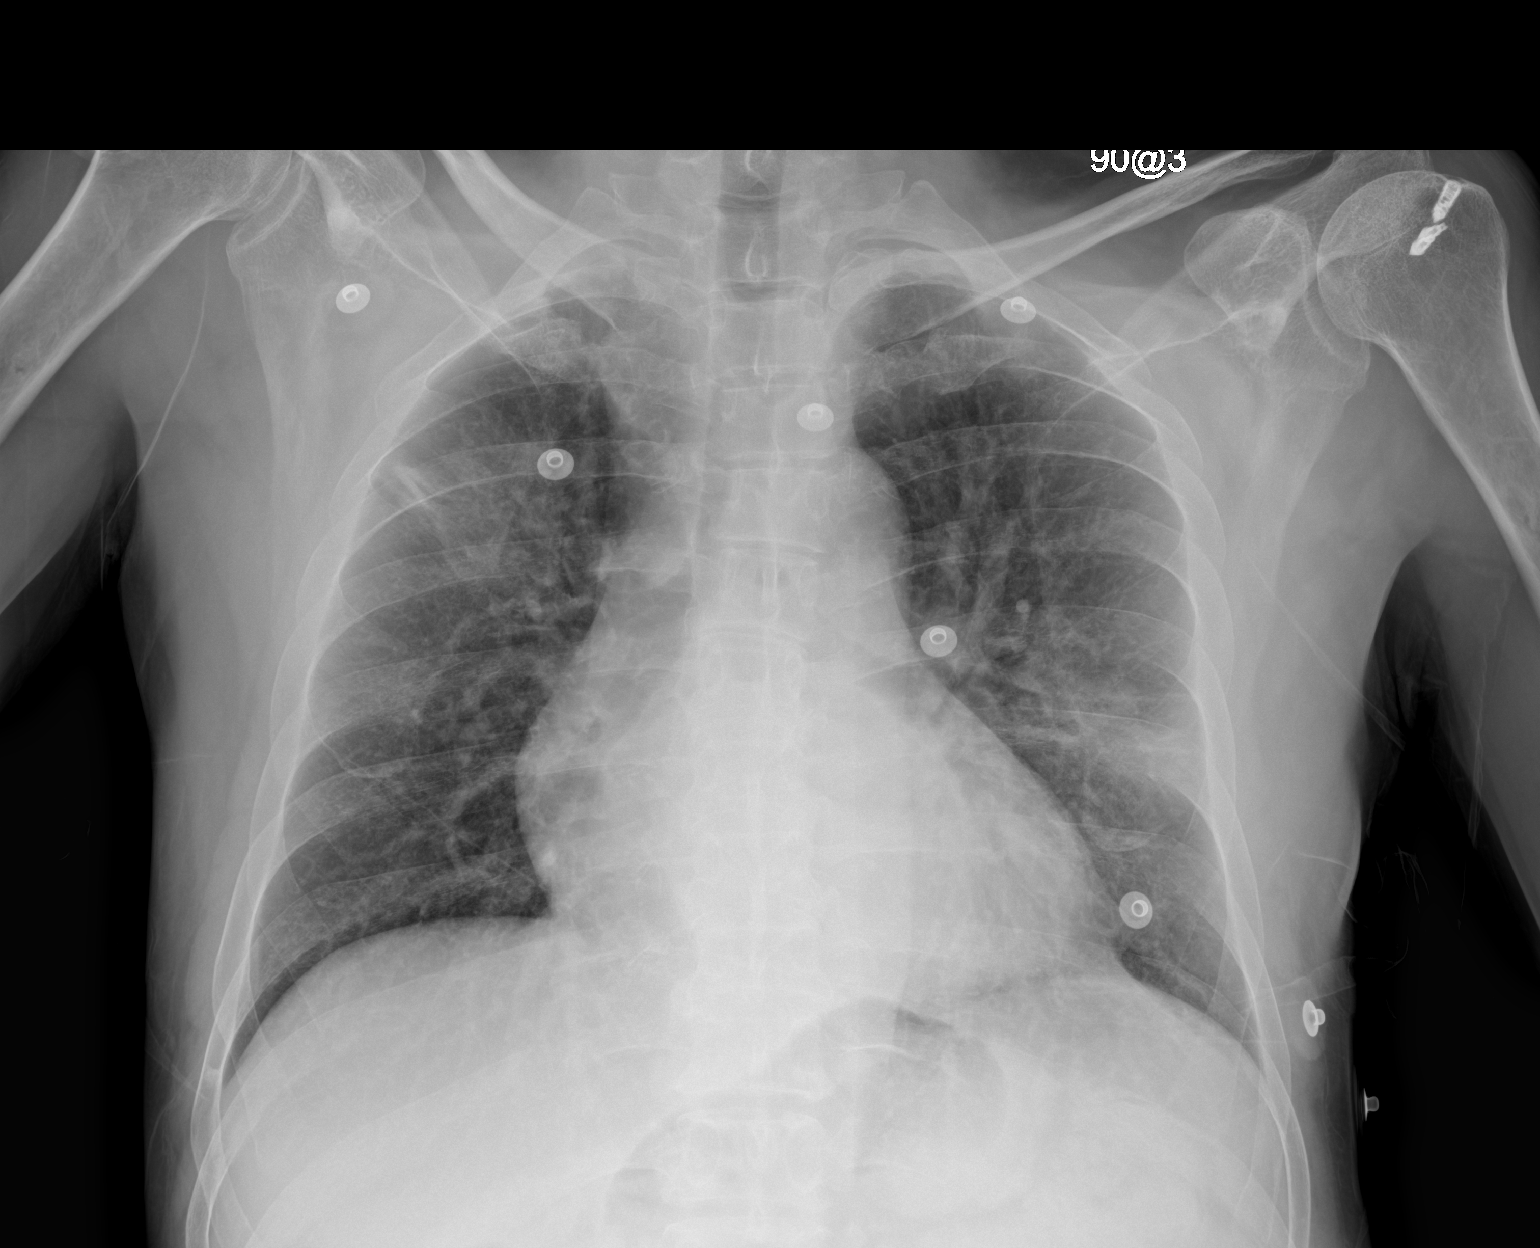

[1 of 1 positions shown; findings below may reference images not displayed]

FINDINGS: Cardiac enlargement. Patchy infiltrates demonstrated in the upper
and mid lungs bilaterally likely representing multifocal pneumonia.
No pleural effusions. No pneumothorax. Mediastinal contours appear
intact. Postoperative changes in the lower cervical spine and left
shoulder.
IMPRESSION: Patchy infiltrates in the upper and mid lungs bilaterally likely
representing multifocal pneumonia.

## 2021-11-27 IMAGING — US US EXTREM LOW VENOUS
1 series · 13 of 24 positions shown · non-contrast
Comparison: None.

CLINICAL DATA: Elevated D-dimer



[Series 1: us venous img lower bilat (dvt) · portal-venous · 13 of 61 slices shown]
[im 1/61]
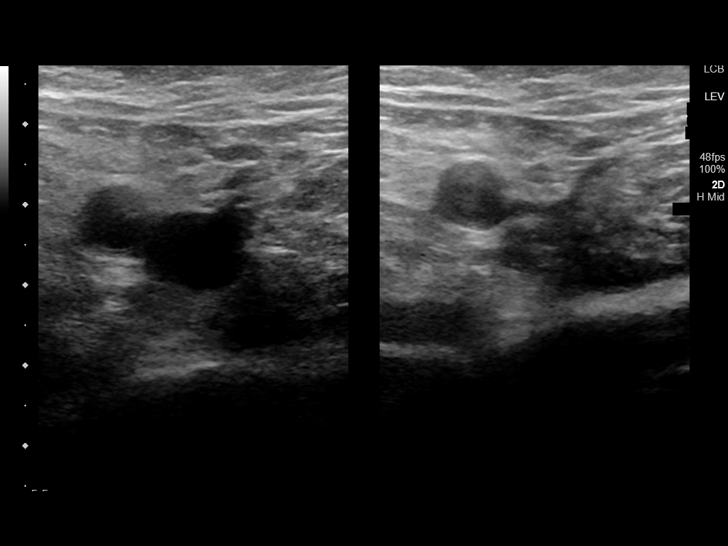
[im 6/61]
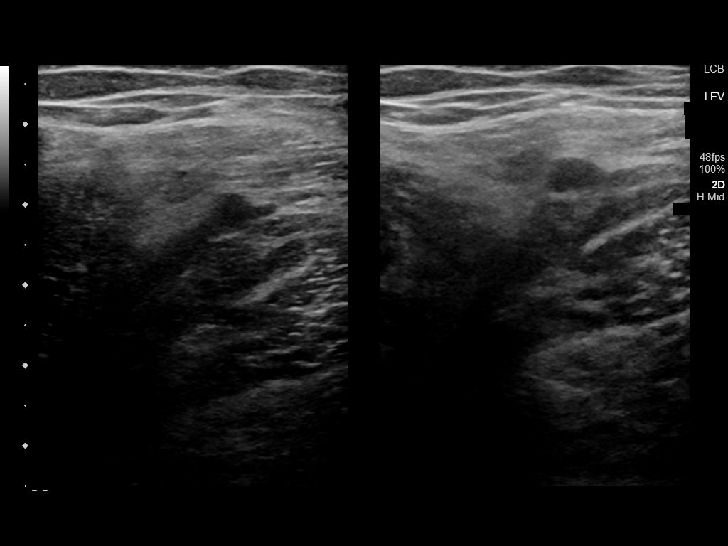
[im 11/61]
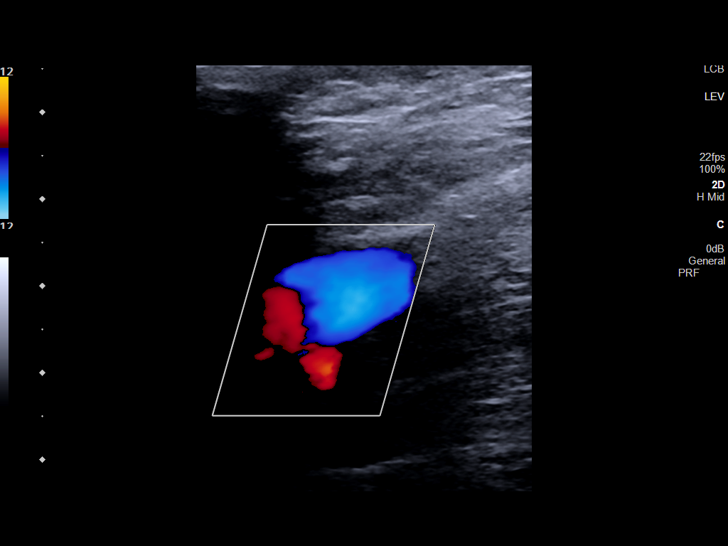
[im 16/61]
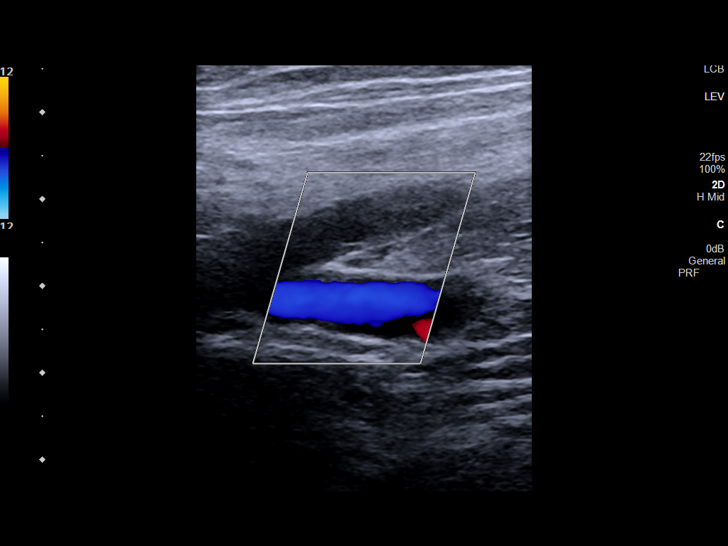
[im 21/61]
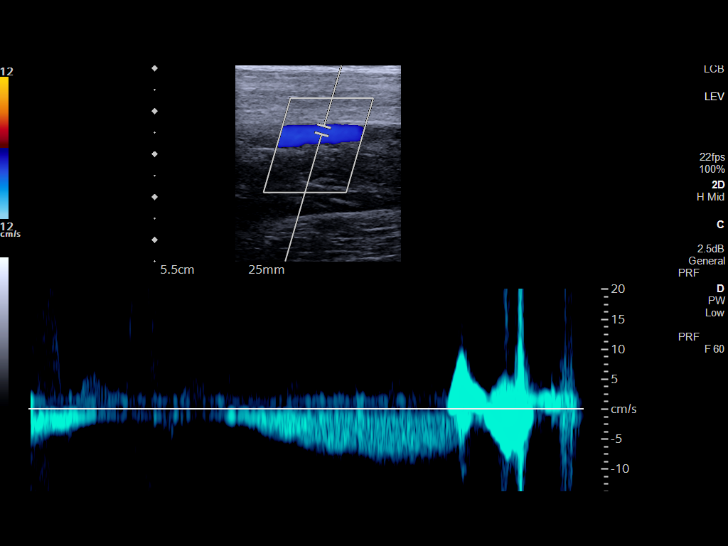
[im 27/61]
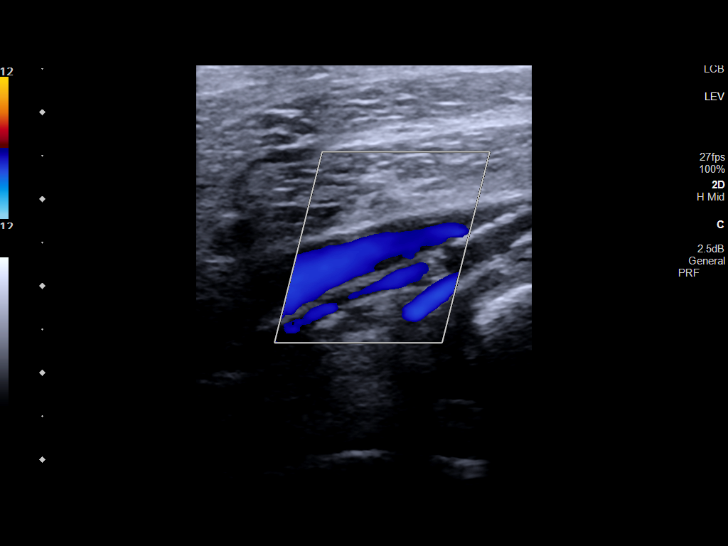
[im 32/61]
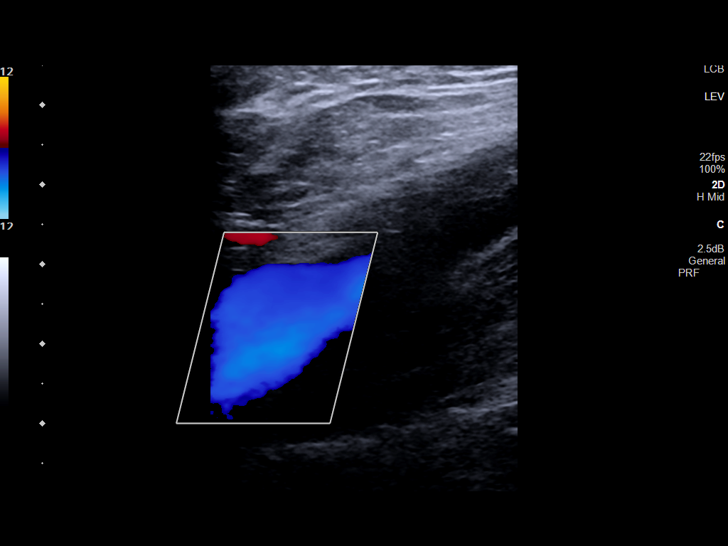
[im 34/61]
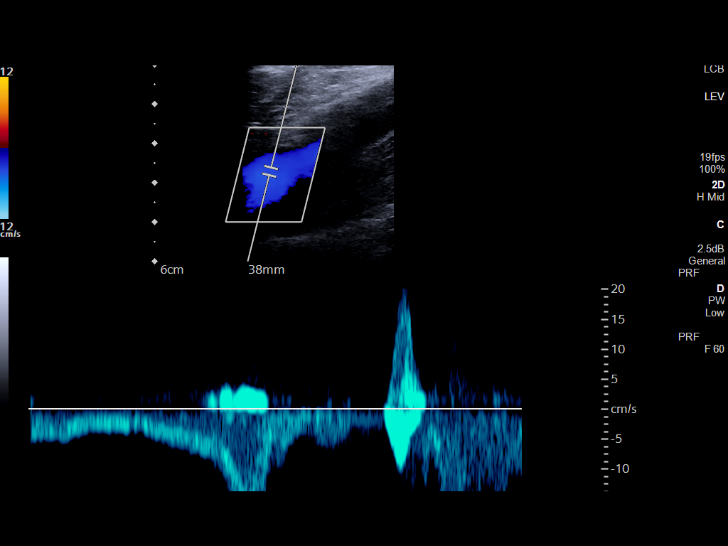
[im 40/61]
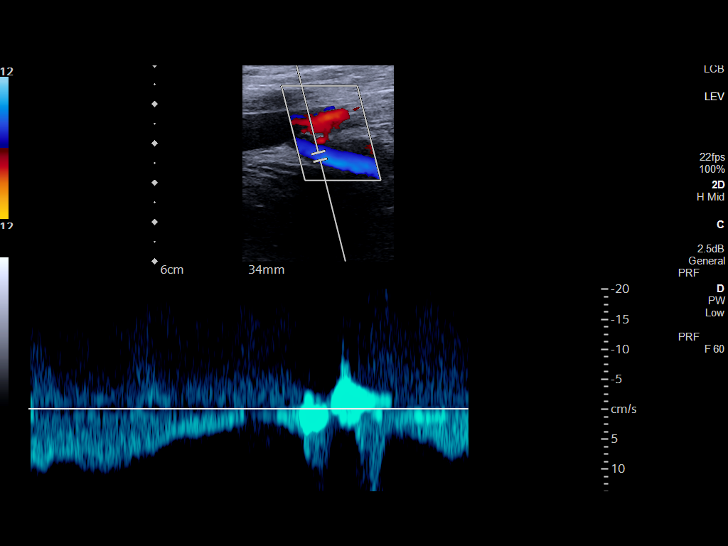
[im 45/61]
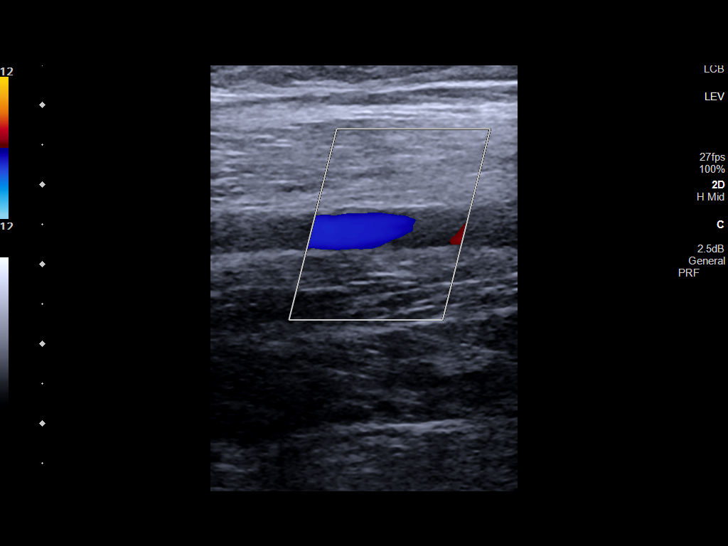
[im 50/61]
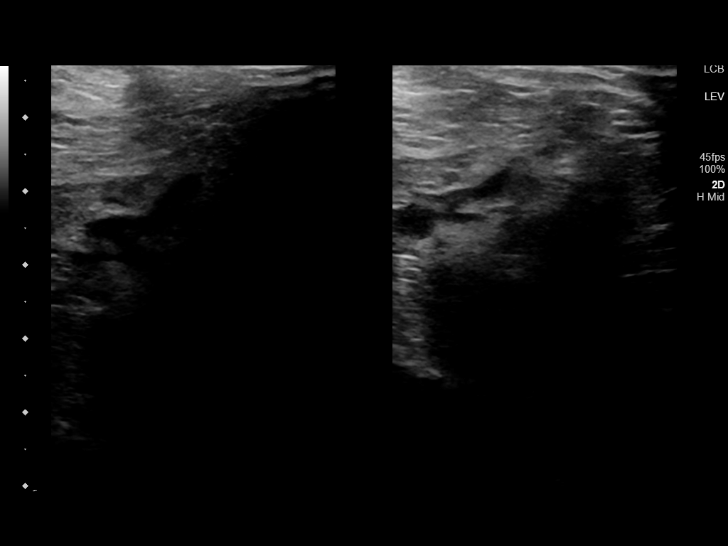
[im 55/61]
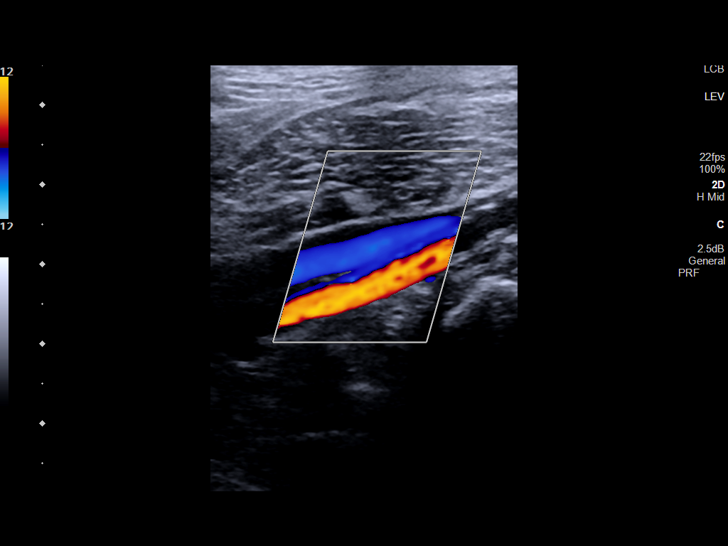
[im 61/61]
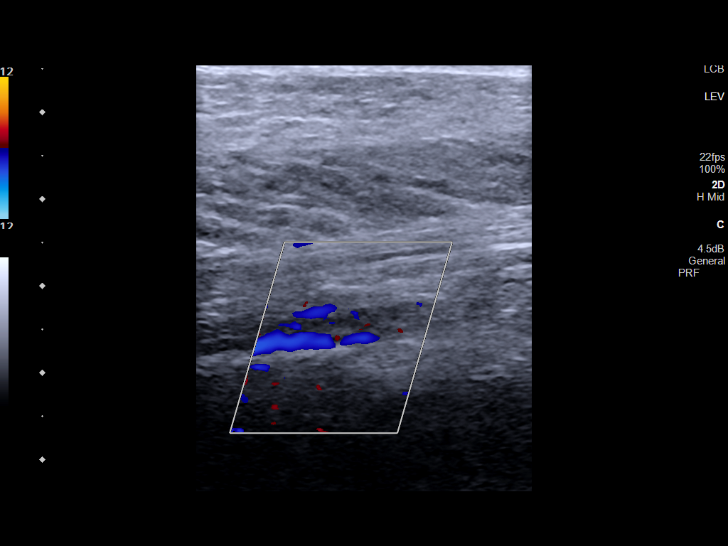

[13 of 24 positions shown; findings below may reference images not displayed]

FINDINGS: RIGHT LOWER EXTREMITY

Common Femoral Vein: No evidence of thrombus. Normal
compressibility, respiratory phasicity and response to augmentation.

Saphenofemoral Junction: No evidence of thrombus. Normal
compressibility and flow on color Doppler imaging.

Profunda Femoral Vein: No evidence of thrombus. Normal
compressibility and flow on color Doppler imaging.

Femoral Vein: No evidence of thrombus. Normal compressibility,
respiratory phasicity and response to augmentation.

Popliteal Vein: No evidence of thrombus. Normal compressibility,
respiratory phasicity and response to augmentation.

Calf Veins: No evidence of thrombus. Normal compressibility and flow
on color Doppler imaging.

Superficial Great Saphenous Vein: No evidence of thrombus. Normal
compressibility.

Other Findings:  None.

LEFT LOWER EXTREMITY

Common Femoral Vein: No evidence of thrombus. Normal
compressibility, respiratory phasicity and response to augmentation.

Saphenofemoral Junction: No evidence of thrombus. Normal
compressibility and flow on color Doppler imaging.

Profunda Femoral Vein: No evidence of thrombus. Normal
compressibility and flow on color Doppler imaging.

Femoral Vein: No evidence of thrombus. Normal compressibility,
respiratory phasicity and response to augmentation.

Popliteal Vein: No evidence of thrombus. Normal compressibility,
respiratory phasicity and response to augmentation.

Calf Veins: No evidence of thrombus. Normal compressibility and flow
on color Doppler imaging.

Superficial Great Saphenous Vein: No evidence of thrombus. Normal
compressibility.

Other Findings:  None.
IMPRESSION: No evidence of deep venous thrombosis in either lower extremity.

## 2022-09-14 ENCOUNTER — Emergency Department: Payer: Medicare HMO

## 2022-09-14 ENCOUNTER — Emergency Department
Admission: EM | Admit: 2022-09-14 | Discharge: 2022-09-14 | Disposition: A | Discharge status: Home / Self Care | Payer: Medicare HMO | Attending: Emergency Medicine | Admitting: Emergency Medicine

## 2022-09-14 DIAGNOSIS — M79605 Pain in left leg: Secondary | ICD-10-CM | POA: Insufficient documentation

## 2022-09-14 DIAGNOSIS — M545 Low back pain, unspecified: Secondary | ICD-10-CM | POA: Insufficient documentation

## 2022-09-14 MED ORDER — LORAZEPAM 2 MG/ML IJ SOLN: 1.0000 mg | Freq: Once | INTRAMUSCULAR | Status: DC

## 2022-09-14 MED ORDER — HYDROMORPHONE HCL 1 MG/ML IJ SOLN
1.0000 mg | Freq: Once | INTRAMUSCULAR | Status: AC
  Administered 2022-09-14: 1 mg via INTRAVENOUS
  Filled 2022-09-14: qty 1

## 2022-09-14 MED ORDER — OXYCODONE-ACETAMINOPHEN 5-325 MG PO TABS
2.0000 | ORAL_TABLET | Freq: Once | ORAL | Status: AC
  Administered 2022-09-14: 2 via ORAL
  Filled 2022-09-14: qty 2

## 2022-09-14 NOTE — ED Notes (Signed)
Pt to MRI
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
## 2022-09-14 NOTE — ED Notes (Signed)
Pt resting quietly, watching tv.

## 2022-09-14 NOTE — ED Notes (Signed)
Md in with pt.  Meds given.  Siderails up x 2.

## 2022-09-14 NOTE — Discharge Instructions (Addendum)
Your MRIs did not show any acute fracture.  You have an old compression deformity.  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  Please continue to take your pain medication.  Please follow-up with your primary doctor and neurologist.  If you continue to have pain you may need to see neurosurgery or get physical therapy.
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
## 2022-09-14 NOTE — ED Notes (Signed)
Pt reports he has back pain and fx T12 from a recent fall.  Pt reports pain worsened after the falls.  Pt ahs pain in right lower back and has tightness in the left lower back and left leg.  Pt alert  iv in place.

## 2022-09-14 NOTE — ED Notes (Signed)
Discharge inst to pt  iv dc'ed.

## 2022-09-14 NOTE — ED Notes (Signed)
Pt on cell phone
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
## 2022-09-14 NOTE — ED Notes (Signed)
Pt return from mri. Pt alert  pt watching tv.
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
## 2022-12-22 DIAGNOSIS — Z23 Encounter for immunization: Secondary | ICD-10-CM | POA: Diagnosis not present

## 2022-12-22 DIAGNOSIS — Z1331 Encounter for screening for depression: Secondary | ICD-10-CM | POA: Diagnosis not present

## 2022-12-22 DIAGNOSIS — E559 Vitamin D deficiency, unspecified: Secondary | ICD-10-CM | POA: Diagnosis not present

## 2022-12-22 DIAGNOSIS — F331 Major depressive disorder, recurrent, moderate: Secondary | ICD-10-CM | POA: Diagnosis not present

## 2022-12-22 DIAGNOSIS — R252 Cramp and spasm: Secondary | ICD-10-CM | POA: Diagnosis not present

## 2022-12-22 DIAGNOSIS — G629 Polyneuropathy, unspecified: Secondary | ICD-10-CM | POA: Diagnosis not present

## 2022-12-22 DIAGNOSIS — Z87891 Personal history of nicotine dependence: Secondary | ICD-10-CM | POA: Diagnosis not present

## 2022-12-22 DIAGNOSIS — G8929 Other chronic pain: Secondary | ICD-10-CM | POA: Diagnosis not present

## 2022-12-22 DIAGNOSIS — D649 Anemia, unspecified: Secondary | ICD-10-CM | POA: Diagnosis not present

## 2022-12-22 DIAGNOSIS — F4310 Post-traumatic stress disorder, unspecified: Secondary | ICD-10-CM | POA: Diagnosis not present

## 2022-12-22 DIAGNOSIS — K1379 Other lesions of oral mucosa: Secondary | ICD-10-CM | POA: Diagnosis not present

## 2022-12-22 DIAGNOSIS — F419 Anxiety disorder, unspecified: Secondary | ICD-10-CM | POA: Diagnosis not present

## 2022-12-22 DIAGNOSIS — F418 Other specified anxiety disorders: Secondary | ICD-10-CM | POA: Diagnosis not present

## 2022-12-27 DIAGNOSIS — M961 Postlaminectomy syndrome, not elsewhere classified: Secondary | ICD-10-CM | POA: Diagnosis not present

## 2022-12-27 DIAGNOSIS — M542 Cervicalgia: Secondary | ICD-10-CM | POA: Diagnosis not present

## 2022-12-27 DIAGNOSIS — G894 Chronic pain syndrome: Secondary | ICD-10-CM | POA: Diagnosis not present

## 2023-02-21 DIAGNOSIS — M961 Postlaminectomy syndrome, not elsewhere classified: Secondary | ICD-10-CM | POA: Diagnosis not present

## 2023-02-21 DIAGNOSIS — Z79891 Long term (current) use of opiate analgesic: Secondary | ICD-10-CM | POA: Diagnosis not present

## 2023-02-21 DIAGNOSIS — Z79899 Other long term (current) drug therapy: Secondary | ICD-10-CM | POA: Diagnosis not present

## 2023-02-21 DIAGNOSIS — G894 Chronic pain syndrome: Secondary | ICD-10-CM | POA: Diagnosis not present

## 2023-02-21 DIAGNOSIS — M542 Cervicalgia: Secondary | ICD-10-CM | POA: Diagnosis not present

## 2023-04-19 DIAGNOSIS — G894 Chronic pain syndrome: Secondary | ICD-10-CM | POA: Diagnosis not present

## 2023-04-19 DIAGNOSIS — M961 Postlaminectomy syndrome, not elsewhere classified: Secondary | ICD-10-CM | POA: Diagnosis not present

## 2023-04-19 DIAGNOSIS — Z79891 Long term (current) use of opiate analgesic: Secondary | ICD-10-CM | POA: Diagnosis not present

## 2023-04-19 DIAGNOSIS — M542 Cervicalgia: Secondary | ICD-10-CM | POA: Diagnosis not present

## 2023-04-19 DIAGNOSIS — M25519 Pain in unspecified shoulder: Secondary | ICD-10-CM | POA: Diagnosis not present

## 2023-04-19 DIAGNOSIS — Z79899 Other long term (current) drug therapy: Secondary | ICD-10-CM | POA: Diagnosis not present

## 2023-04-19 DIAGNOSIS — M25569 Pain in unspecified knee: Secondary | ICD-10-CM | POA: Diagnosis not present

## 2023-04-19 DIAGNOSIS — G4700 Insomnia, unspecified: Secondary | ICD-10-CM | POA: Diagnosis not present

## 2023-05-18 DIAGNOSIS — R220 Localized swelling, mass and lump, head: Secondary | ICD-10-CM | POA: Diagnosis not present

## 2023-05-18 DIAGNOSIS — K1379 Other lesions of oral mucosa: Secondary | ICD-10-CM | POA: Diagnosis not present

## 2023-05-18 DIAGNOSIS — R42 Dizziness and giddiness: Secondary | ICD-10-CM | POA: Diagnosis not present

## 2023-05-18 DIAGNOSIS — F410 Panic disorder [episodic paroxysmal anxiety] without agoraphobia: Secondary | ICD-10-CM | POA: Diagnosis not present

## 2023-05-18 DIAGNOSIS — F4310 Post-traumatic stress disorder, unspecified: Secondary | ICD-10-CM | POA: Diagnosis not present

## 2023-05-18 DIAGNOSIS — F1120 Opioid dependence, uncomplicated: Secondary | ICD-10-CM | POA: Diagnosis not present

## 2023-05-18 DIAGNOSIS — F331 Major depressive disorder, recurrent, moderate: Secondary | ICD-10-CM | POA: Diagnosis not present

## 2023-05-18 DIAGNOSIS — Z1331 Encounter for screening for depression: Secondary | ICD-10-CM | POA: Diagnosis not present

## 2023-05-18 DIAGNOSIS — Z1330 Encounter for screening examination for mental health and behavioral disorders, unspecified: Secondary | ICD-10-CM | POA: Diagnosis not present

## 2023-06-14 DIAGNOSIS — M25519 Pain in unspecified shoulder: Secondary | ICD-10-CM | POA: Diagnosis not present

## 2023-06-14 DIAGNOSIS — M25569 Pain in unspecified knee: Secondary | ICD-10-CM | POA: Diagnosis not present

## 2023-06-14 DIAGNOSIS — M542 Cervicalgia: Secondary | ICD-10-CM | POA: Diagnosis not present

## 2023-06-14 DIAGNOSIS — G4700 Insomnia, unspecified: Secondary | ICD-10-CM | POA: Diagnosis not present

## 2023-06-14 DIAGNOSIS — Z79899 Other long term (current) drug therapy: Secondary | ICD-10-CM | POA: Diagnosis not present

## 2023-06-14 DIAGNOSIS — M961 Postlaminectomy syndrome, not elsewhere classified: Secondary | ICD-10-CM | POA: Diagnosis not present

## 2023-06-14 DIAGNOSIS — G894 Chronic pain syndrome: Secondary | ICD-10-CM | POA: Diagnosis not present

## 2023-06-14 DIAGNOSIS — M5412 Radiculopathy, cervical region: Secondary | ICD-10-CM | POA: Diagnosis not present

## 2023-06-14 DIAGNOSIS — Z79891 Long term (current) use of opiate analgesic: Secondary | ICD-10-CM | POA: Diagnosis not present

## 2023-06-15 DIAGNOSIS — F32A Depression, unspecified: Secondary | ICD-10-CM | POA: Diagnosis not present

## 2023-06-15 DIAGNOSIS — F331 Major depressive disorder, recurrent, moderate: Secondary | ICD-10-CM | POA: Diagnosis not present

## 2023-06-15 DIAGNOSIS — R42 Dizziness and giddiness: Secondary | ICD-10-CM | POA: Diagnosis not present

## 2023-06-15 DIAGNOSIS — F4310 Post-traumatic stress disorder, unspecified: Secondary | ICD-10-CM | POA: Diagnosis not present

## 2023-07-12 DIAGNOSIS — G894 Chronic pain syndrome: Secondary | ICD-10-CM | POA: Diagnosis not present

## 2023-07-12 DIAGNOSIS — M542 Cervicalgia: Secondary | ICD-10-CM | POA: Diagnosis not present

## 2023-07-12 DIAGNOSIS — M961 Postlaminectomy syndrome, not elsewhere classified: Secondary | ICD-10-CM | POA: Diagnosis not present

## 2023-07-12 DIAGNOSIS — Z79891 Long term (current) use of opiate analgesic: Secondary | ICD-10-CM | POA: Diagnosis not present

## 2023-08-24 DIAGNOSIS — F331 Major depressive disorder, recurrent, moderate: Secondary | ICD-10-CM | POA: Diagnosis not present

## 2023-08-24 DIAGNOSIS — R5382 Chronic fatigue, unspecified: Secondary | ICD-10-CM | POA: Diagnosis not present

## 2023-09-06 DIAGNOSIS — M961 Postlaminectomy syndrome, not elsewhere classified: Secondary | ICD-10-CM | POA: Diagnosis not present

## 2023-09-06 DIAGNOSIS — Z79891 Long term (current) use of opiate analgesic: Secondary | ICD-10-CM | POA: Diagnosis not present

## 2023-09-06 DIAGNOSIS — G4700 Insomnia, unspecified: Secondary | ICD-10-CM | POA: Diagnosis not present

## 2023-09-06 DIAGNOSIS — M25569 Pain in unspecified knee: Secondary | ICD-10-CM | POA: Diagnosis not present

## 2023-09-06 DIAGNOSIS — M25519 Pain in unspecified shoulder: Secondary | ICD-10-CM | POA: Diagnosis not present

## 2023-09-06 DIAGNOSIS — M542 Cervicalgia: Secondary | ICD-10-CM | POA: Diagnosis not present

## 2023-09-06 DIAGNOSIS — G894 Chronic pain syndrome: Secondary | ICD-10-CM | POA: Diagnosis not present

## 2024-03-21 DIAGNOSIS — S60222A Contusion of left hand, initial encounter: Secondary | ICD-10-CM | POA: Insufficient documentation

## 2024-05-22 ENCOUNTER — Ambulatory Visit: Admitting: Nurse Practitioner
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
## 2024-10-14 NOTE — Progress Notes (Signed)
 This video encounter was conducted with the patient's (or proxy's) verbal consent via secure, interactive audio and video telecommunications.    The patient (or proxy) was instructed by the virtual care center staff to have this encounter in a suitably private space and to only have persons present to whom they give permission to participate. In addition, patient identity was confirmed by use of name and date of birth.   Telehealth visit was conducted with [name] via HIPAA compliant video platform.

## 2024-11-25 NOTE — Progress Notes (Signed)
 [facility] [employer] was able to reach the patient by phone call.  The details of interventions are as follows:     11/25/2024   12:54 PM  Gap Closure  Age Group: Adult  Adult Yearly Physical Interventions Patient declined scheduling - Noted in Specialty Comments  Comments: pt stated not at this time    [facility]    [address]; [place], [place] [address] l  [email or web address] l [phone]   For more information on [facility] services, click here.

## 2024-11-30 ENCOUNTER — Emergency Department

## 2024-11-30 ENCOUNTER — Emergency Department: Admission: EM | Admit: 2024-11-30 | Discharge: 2024-11-30 | Disposition: A | Discharge status: Home / Self Care

## 2024-11-30 ENCOUNTER — Other Ambulatory Visit: Payer: Self-pay

## 2024-11-30 DIAGNOSIS — R509 Fever, unspecified: Secondary | ICD-10-CM | POA: Diagnosis not present

## 2024-11-30 DIAGNOSIS — K529 Noninfective gastroenteritis and colitis, unspecified: Secondary | ICD-10-CM | POA: Diagnosis not present

## 2024-11-30 DIAGNOSIS — R1030 Lower abdominal pain, unspecified: Secondary | ICD-10-CM | POA: Diagnosis present

## 2024-11-30 LAB — COMPREHENSIVE METABOLIC PANEL WITH GFR
ALT: 22 U/L (ref 0–44)
AST: 55 U/L — ABNORMAL HIGH (ref 15–41)
Albumin: 4.4 g/dL (ref 3.5–5.0)
Alkaline Phosphatase: 79 U/L (ref 38–126)
Anion gap: 14 (ref 5–15)
BUN: 8 mg/dL (ref 6–20)
CO2: 25 mmol/L (ref 22–32)
Calcium: 9.1 mg/dL (ref 8.9–10.3)
Chloride: 98 mmol/L (ref 98–111)
Creatinine, Ser: 0.99 mg/dL (ref 0.61–1.24)
GFR, Estimated: >60 mL/min (ref >60)
Glucose, Bld: 139 mg/dL — ABNORMAL HIGH (ref 70–99)
Potassium: 3.5 mmol/L (ref 3.5–5.1)
Sodium: 137 mmol/L (ref 135–145)
Total Bilirubin: 0.5 mg/dL (ref 0.0–1.2)
Total Protein: 7.3 g/dL (ref 6.5–8.1)

## 2024-11-30 LAB — CBC
HCT: 47 % (ref 39.0–52.0)
Hemoglobin: 15.8 g/dL (ref 13.0–17.0)
MCH: 28.8 pg (ref 26.0–34.0)
MCHC: 33.6 g/dL (ref 30.0–36.0)
MCV: 85.6 fL (ref 80.0–100.0)
Platelets: 207 K/uL (ref 150–400)
RBC: 5.49 MIL/uL (ref 4.22–5.81)
RDW: 12.1 % (ref 11.5–15.5)
WBC: 6.8 K/uL (ref 4.0–10.5)
nRBC: 0 % (ref 0.0–0.2)

## 2024-11-30 LAB — URINALYSIS, ROUTINE W REFLEX MICROSCOPIC
Bilirubin Urine: NEGATIVE
Glucose, UA: NEGATIVE mg/dL
Hgb urine dipstick: NEGATIVE
Ketones, ur: NEGATIVE mg/dL
Leukocytes,Ua: NEGATIVE
Nitrite: NEGATIVE
Protein, ur: NEGATIVE mg/dL
Specific Gravity, Urine: 1.026 (ref 1.005–1.030)
pH: 7 (ref 5.0–8.0)

## 2024-11-30 LAB — RESP PANEL BY RT-PCR (RSV, FLU A&B, COVID)  RVPGX2
Influenza A by PCR: NEGATIVE
Influenza B by PCR: NEGATIVE
Resp Syncytial Virus by PCR: NEGATIVE
SARS Coronavirus 2 by RT PCR: NEGATIVE

## 2024-11-30 LAB — LIPASE, BLOOD: Lipase: 18 U/L (ref 11–51)

## 2024-11-30 MED ORDER — MORPHINE SULFATE (PF) 4 MG/ML IV SOLN
4.0000 mg | Freq: Once | INTRAVENOUS | Status: DC
  Filled 2024-11-30: qty 1

## 2024-11-30 MED ORDER — ONDANSETRON 4 MG PO TBDP: 4.0000 mg | ORAL_TABLET | Freq: Three times a day (TID) | ORAL | 0 refills | Status: AC | PRN

## 2024-11-30 MED ORDER — SODIUM CHLORIDE 0.9 % IV BOLUS
1000.0000 mL | Freq: Once | INTRAVENOUS | Status: AC
  Administered 2024-11-30: 1000 mL via INTRAVENOUS

## 2024-11-30 MED ORDER — AMOXICILLIN-POT CLAVULANATE 875-125 MG PO TABS
1.0000 | ORAL_TABLET | Freq: Once | ORAL | Status: AC
  Administered 2024-11-30: 1 via ORAL
  Filled 2024-11-30: qty 1

## 2024-11-30 MED ORDER — DIPHENHYDRAMINE HCL 50 MG/ML IJ SOLN
25.0000 mg | Freq: Once | INTRAMUSCULAR | Status: AC
  Administered 2024-11-30: 25 mg via INTRAVENOUS
  Filled 2024-11-30: qty 1

## 2024-11-30 MED ORDER — METOCLOPRAMIDE HCL 5 MG/ML IJ SOLN
10.0000 mg | Freq: Once | INTRAMUSCULAR | Status: AC
  Administered 2024-11-30: 10 mg via INTRAVENOUS
  Filled 2024-11-30: qty 2

## 2024-11-30 MED ORDER — DICYCLOMINE HCL 10 MG PO CAPS: 10.0000 mg | ORAL_CAPSULE | Freq: Three times a day (TID) | ORAL | 0 refills | Status: AC

## 2024-11-30 MED ORDER — ONDANSETRON HCL 4 MG/2ML IJ SOLN
4.0000 mg | Freq: Once | INTRAMUSCULAR | Status: AC
  Administered 2024-11-30: 4 mg via INTRAVENOUS
  Filled 2024-11-30: qty 2

## 2024-11-30 MED ORDER — IOHEXOL 300 MG/ML  SOLN
100.0000 mL | Freq: Once | INTRAMUSCULAR | Status: AC | PRN
  Administered 2024-11-30: 100 mL via INTRAVENOUS

## 2024-11-30 MED ORDER — AMOXICILLIN-POT CLAVULANATE 875-125 MG PO TABS: 1.0000 | ORAL_TABLET | Freq: Two times a day (BID) | ORAL | 0 refills | Status: AC

## 2024-11-30 MED ORDER — DICYCLOMINE HCL 10 MG PO CAPS
10.0000 mg | ORAL_CAPSULE | Freq: Once | ORAL | Status: AC
  Administered 2024-11-30: 10 mg via ORAL
  Filled 2024-11-30: qty 1

## 2024-11-30 NOTE — Discharge Instructions (Signed)
 You were seen today due to concern of abdominal pain.  It seems that you likely have a infection within your colon, I have written for some antibiotics to take, please take these as instructed.  I have also written for some nausea medicine as well as some medicine for pain please take this as instructed.  If you start having fevers, increased abdominal pain, blood in your stool, or any other symptoms you find concerning please return to the emergency department immediately for further medical management.  You should otherwise follow-up with a gastroenterologist, to get a colonoscopy performed as we have discussed due to concern of a possible abnormal lesion in your colon.

## 2024-11-30 NOTE — ED Provider Notes (Signed)
 [facility] Provider Note    Event Date/Time   First MD Initiated Contact with Patient 11/30/24 1507     (approximate)   History   Abdominal Pain   HPI  [name] is a 58 y.o. male who presents today with concern of lower abdominal pain.  Having accompanying nausea vomiting diarrhea ongoing for about 3 to 4 days now.  Feels like he has not been able to tolerate any p.o., feels like his abdomen has become increasingly distended.  Endorses some occasional fevers at home no documented temperature.  States he feels very weak, has tried taking some Imodium without much success.  States that he is having a bowel movement about every hour.  No known sick contacts although he does have some accompanying congestion.     Physical Exam   Triage Vital Signs: ED Triage Vitals  Encounter Vitals Group     BP 11/30/24 1150 (!) 124/98     Girls Systolic BP Percentile --      Girls Diastolic BP Percentile --      Boys Systolic BP Percentile --      Boys Diastolic BP Percentile --      Pulse Rate 11/30/24 1150 (!) 121     Resp 11/30/24 1150 18     Temp 11/30/24 1150 97.7 F (36.5 C)     Temp Source 11/30/24 1150 Oral     SpO2 11/30/24 1150 98 %     Weight 11/30/24 1151 170 lb (77.1 kg)     Height 11/30/24 1151 5' 9 (1.753 m)     Head Circumference --      Peak Flow --      Pain Score 11/30/24 1154 10     Pain Loc --      Pain Education --      Exclude from Growth Chart --     Most recent vital signs: Vitals:   11/30/24 1150 11/30/24 1457  BP: (!) 124/98 110/78  Pulse: (!) 121 87  Resp: 18 18  Temp: 97.7 F (36.5 C) 98.2 F (36.8 C)  SpO2: 98% 98%     General: Awake, appears uncomfortable CV:  Good peripheral perfusion.  Resp:  Normal effort.  Abd:  No distention.  Generalized discomfort to palpation, greatest in the right lower quadrant of the abdomen Other:     ED Results / Procedures / Treatments   Labs (all labs ordered are listed,  but only abnormal results are displayed) Labs Reviewed  COMPREHENSIVE METABOLIC PANEL WITH GFR - Abnormal; Notable for the following components:      Result Value   Glucose, Bld 139 (*)    AST 55 (*)    All other components within normal limits  URINALYSIS, ROUTINE W REFLEX MICROSCOPIC - Abnormal; Notable for the following components:   Color, Urine STRAW (*)    APPearance CLEAR (*)    All other components within normal limits  RESP PANEL BY RT-PCR (RSV, FLU A&B, COVID)  RVPGX2  LIPASE, BLOOD  CBC     EKG  Sinus rhythm with rate of about 110, axis of about 15, intervals appear to be within normal limits, no obvious ischemia appreciated on this EKG.   RADIOLOGY   PROCEDURES:  Critical Care performed: No  Procedures   MEDICATIONS ORDERED IN ED: Medications  morphine  (PF) 4 MG/ML injection 4 mg (4 mg Intravenous Not Given 11/30/24 1650)  dicyclomine  (BENTYL ) capsule 10 mg (has no administration in time range)  amoxicillin -clavulanate (AUGMENTIN ) 875-125 MG per tablet 1 tablet (has no administration in time range)  ondansetron  (ZOFRAN ) injection 4 mg (4 mg Intravenous Given 11/30/24 1205)  sodium chloride  0.9 % bolus 1,000 mL (0 mLs Intravenous Stopped 11/30/24 1247)  sodium chloride  0.9 % bolus 1,000 mL (1,000 mLs Intravenous New Bag/Given 11/30/24 1628)  ondansetron  (ZOFRAN ) injection 4 mg (4 mg Intravenous Given 11/30/24 1629)  iohexol  (OMNIPAQUE ) 300 MG/ML solution 100 mL (100 mLs Intravenous Contrast Given 11/30/24 1555)     IMPRESSION / MDM / ASSESSMENT AND PLAN / ED COURSE  I reviewed the triage vital signs and the nursing notes.                               Patient's presentation is most consistent with acute complicated illness / injury requiring diagnostic workup.  58 year old male who presents today with concern of abdominal pain nausea vomiting diarrhea ongoing for about 4 days now.  Arrival here tachycardic, he does appear to be uncomfortable.   Fortunately his labs are reassuring his heart rate has improved while here.  He does have tenderness to palpation along the right lower quadrant of his abdomen.  Suspect most likely gastroenteritis however given the location of discomfort and the degree of tenderness, believe reasonable to obtain CT imaging to further assess for appendicitis versus possibly diverticulitis, I feel unlikely aortic etiology or other vascular cause.  Will follow-up imaging attempt symptomatic management and determine appropriate disposition accordingly.   Clinical Course as of 11/30/24 2344  Sun Nov 30, 2024  1647 CT demonstrating evidence of likely colitis.  They read as a questionable neoplasm, will discuss results of the test with the patient.  Otherwise plan to start antibiotics and believe reasonable for discharge home. [SK]  1648 Patient had declined the morphine , 1 something little bit less strong to help with the pain.  Wrote for Bentyl  for him.  I discussed return precautions, he is agreeable with the plan. [SK]  1840 Patient was prepared for discharge, and nursing line and to provide patient with paperwork, he is unsure over and the ball grabbing his stomach stating that he is still having significant discomfort.  Will try Reglan  and Benadryl  to assist with nausea control. [SK]  1932 Patient is asleep, was woken up he states that he feels much better, anticipate reasonable for discharge. [SK]    Clinical Course User Index [SK] Fernand Rossie HERO, MD     FINAL CLINICAL IMPRESSION(S) / ED DIAGNOSES   Final diagnoses:  Colitis     Rx / DC Orders   ED Discharge Orders          Ordered    amoxicillin -clavulanate (AUGMENTIN ) 875-125 MG tablet  2 times daily        11/30/24 1701    Ambulatory Referral to Primary Care (Establish Care)        11/30/24 1702    ondansetron  (ZOFRAN -ODT) 4 MG disintegrating tablet  Every 8 hours PRN        11/30/24 1705    dicyclomine  (BENTYL ) 10 MG capsule  3 times daily  before meals & bedtime        11/30/24 1705             Note:  This document was prepared using Dragon voice recognition software and may include unintentional dictation errors.   Fernand Rossie HERO, MD 11/30/24 [phone]

## 2024-11-30 NOTE — ED Notes (Signed)
 Pt was given applesauce and crackers. The pt advised he did eat the applesauce. The pt was asked if he felt ok to go home. The pt advised he was very weak and sick. The pt was advised I would ask the doctor to speak with him.

## 2024-11-30 NOTE — ED Triage Notes (Signed)
 C/O N/V/D abd pain since Thursday night.  C/O wekaness.

## 2025-01-13 ENCOUNTER — Other Ambulatory Visit: Payer: Self-pay
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
## 2025-01-15 ENCOUNTER — Ambulatory Visit: Admitting: Family Medicine

## 2025-01-15 ENCOUNTER — Encounter: Payer: Self-pay | Admitting: Family Medicine

## 2025-01-15 VITALS — BP 130/82 | HR 85 | Temp 98.2°F | Ht 69.0 in | Wt 172.2 lb

## 2025-01-15 DIAGNOSIS — K529 Noninfective gastroenteritis and colitis, unspecified: Secondary | ICD-10-CM

## 2025-01-15 DIAGNOSIS — R1033 Periumbilical pain: Secondary | ICD-10-CM

## 2025-01-15 DIAGNOSIS — R109 Unspecified abdominal pain: Secondary | ICD-10-CM

## 2025-01-15 DIAGNOSIS — Z1211 Encounter for screening for malignant neoplasm of colon: Secondary | ICD-10-CM

## 2025-01-15 MED ORDER — NA SULFATE-K SULFATE-MG SULF 17.5-3.13-1.6 GM/177ML PO SOLN: 1.0000 | Freq: Once | ORAL | 0 refills | Status: AC

## 2025-01-15 NOTE — Patient Instructions (Addendum)
 CT scheduled @ [facility] entrance-01/22/25 arrive at 1:15 pm to start drinking the contrast.  Please go to [facility] at the corner of [address] and [address] to have your labs done.

## 2025-01-20 ENCOUNTER — Telehealth: Payer: Self-pay

## 2025-01-20 NOTE — Telephone Encounter (Signed)
 Patient called to cancel his colonoscopy and stated that he will come in March to see Grayce and talk about it.

## 2025-01-22 ENCOUNTER — Ambulatory Visit

## 2025-01-30 ENCOUNTER — Ambulatory Visit: Admit: 2025-01-30

## 2025-02-19 ENCOUNTER — Ambulatory Visit: Admitting: Family Medicine
